# Patient Record
Sex: Female | Born: 1975 | Race: White | Hispanic: No | Marital: Married
Health system: Midwestern US, Community
[De-identification: ages and names within clinical notes are randomized; demographics above are authoritative.]

## PROBLEM LIST (undated history)

## (undated) DIAGNOSIS — F419 Anxiety disorder, unspecified: Secondary | ICD-10-CM

## (undated) DIAGNOSIS — Z8759 Personal history of other complications of pregnancy, childbirth and the puerperium: Secondary | ICD-10-CM

## (undated) DIAGNOSIS — E785 Hyperlipidemia, unspecified: Secondary | ICD-10-CM

## (undated) DIAGNOSIS — IMO0002 Reserved for concepts with insufficient information to code with codable children: Secondary | ICD-10-CM

## (undated) DIAGNOSIS — I10 Essential (primary) hypertension: Secondary | ICD-10-CM

## (undated) DIAGNOSIS — D649 Anemia, unspecified: Secondary | ICD-10-CM

## (undated) DIAGNOSIS — F410 Panic disorder [episodic paroxysmal anxiety] without agoraphobia: Secondary | ICD-10-CM

## (undated) HISTORY — DX: Personal history of other complications of pregnancy, childbirth and the puerperium: Z87.59

## (undated) HISTORY — DX: Reserved for concepts with insufficient information to code with codable children: IMO0002

## (undated) HISTORY — PX: TUBAL LIGATION: SHX77

## (undated) HISTORY — DX: Panic disorder (episodic paroxysmal anxiety): F41.0

## (undated) HISTORY — DX: Anemia, unspecified: D64.9

## (undated) HISTORY — DX: Anxiety disorder, unspecified: F41.9

## (undated) HISTORY — PX: FRACTURE SURGERY: SHX138

---

## 1998-11-01 ENCOUNTER — Inpatient Hospital Stay (HOSPITAL_COMMUNITY): Admission: AD | Admit: 1998-11-01 | Discharge: 1998-11-01 | Payer: Self-pay | Admitting: Obstetrics

## 1998-12-29 ENCOUNTER — Ambulatory Visit (HOSPITAL_COMMUNITY): Admission: RE | Admit: 1998-12-29 | Discharge: 1998-12-29 | Payer: Self-pay | Admitting: *Deleted

## 1999-01-20 ENCOUNTER — Inpatient Hospital Stay (HOSPITAL_COMMUNITY): Admission: AD | Admit: 1999-01-20 | Discharge: 1999-01-20 | Payer: Self-pay | Admitting: *Deleted

## 1999-05-30 ENCOUNTER — Inpatient Hospital Stay (HOSPITAL_COMMUNITY): Admission: AD | Admit: 1999-05-30 | Discharge: 1999-05-30 | Payer: Self-pay | Admitting: *Deleted

## 1999-06-14 ENCOUNTER — Inpatient Hospital Stay (HOSPITAL_COMMUNITY): Admission: AD | Admit: 1999-06-14 | Discharge: 1999-06-15 | Payer: Self-pay | Admitting: *Deleted

## 1999-07-14 ENCOUNTER — Encounter: Admission: RE | Admit: 1999-07-14 | Discharge: 1999-07-14 | Payer: Self-pay | Admitting: Obstetrics

## 1999-07-19 ENCOUNTER — Encounter: Admission: RE | Admit: 1999-07-19 | Discharge: 1999-07-19 | Payer: Self-pay | Admitting: Obstetrics & Gynecology

## 1999-08-08 ENCOUNTER — Ambulatory Visit (HOSPITAL_COMMUNITY): Admission: RE | Admit: 1999-08-08 | Discharge: 1999-08-08 | Payer: Self-pay | Admitting: Obstetrics & Gynecology

## 1999-09-13 ENCOUNTER — Encounter: Admission: RE | Admit: 1999-09-13 | Discharge: 1999-09-13 | Payer: Self-pay | Admitting: Obstetrics & Gynecology

## 2003-04-17 ENCOUNTER — Emergency Department (HOSPITAL_COMMUNITY): Admission: EM | Admit: 2003-04-17 | Discharge: 2003-04-18 | Payer: Self-pay | Admitting: Emergency Medicine

## 2006-03-19 LAB — HM PAP SMEAR: HM Pap smear: ABNORMAL

## 2006-05-18 LAB — HM COLONOSCOPY

## 2006-06-04 ENCOUNTER — Encounter (INDEPENDENT_AMBULATORY_CARE_PROVIDER_SITE_OTHER): Payer: Self-pay | Admitting: *Deleted

## 2006-06-04 ENCOUNTER — Ambulatory Visit (HOSPITAL_COMMUNITY): Admission: RE | Admit: 2006-06-04 | Discharge: 2006-06-04 | Payer: Self-pay | Admitting: Gastroenterology

## 2008-02-01 ENCOUNTER — Emergency Department (HOSPITAL_COMMUNITY): Admission: EM | Admit: 2008-02-01 | Discharge: 2008-02-02 | Payer: Self-pay | Admitting: Emergency Medicine

## 2008-05-01 ENCOUNTER — Ambulatory Visit (HOSPITAL_COMMUNITY): Admission: RE | Admit: 2008-05-01 | Discharge: 2008-05-01 | Payer: Self-pay | Admitting: Obstetrics and Gynecology

## 2008-05-01 ENCOUNTER — Encounter (INDEPENDENT_AMBULATORY_CARE_PROVIDER_SITE_OTHER): Payer: Self-pay | Admitting: Obstetrics and Gynecology

## 2008-05-09 ENCOUNTER — Emergency Department (HOSPITAL_COMMUNITY): Admission: EM | Admit: 2008-05-09 | Discharge: 2008-05-09 | Payer: Self-pay | Admitting: Family Medicine

## 2009-02-11 IMAGING — US US PELVIS COMPLETE MODIFY
1 series · 14 of 25 positions shown · non-contrast
Comparison: none

CLINICAL DATA: Abdominal and rectal pain. 
 TRANSABDOMINAL AND TRANSVAGINAL PELVIC ULTRASOUND:
TECHNIQUE: Both transabdominal and transvaginal ultrasound examinations of the pelvis were performed including evaluation of the uterus, ovaries, adnexal regions, and pelvic cul-de-sac.

[Series 1: unknown · 0.27mm/px · 14 of 47 slices shown]
[im 1/47]
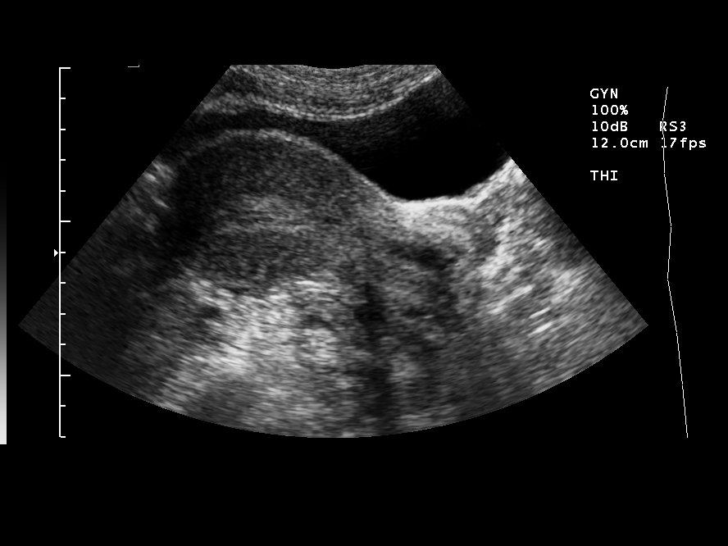
[im 4/47]
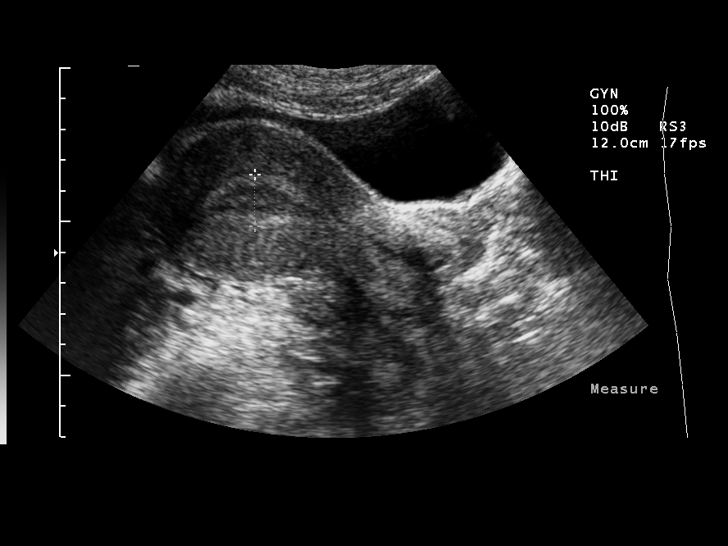
[im 8/47]
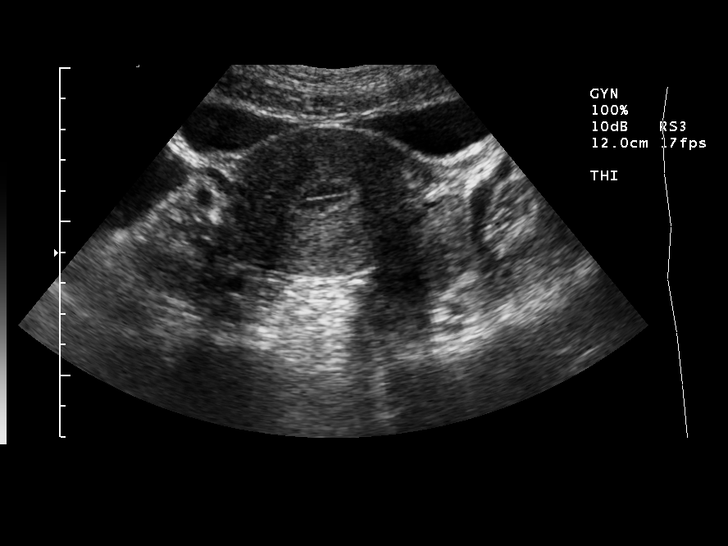
[im 12/47]
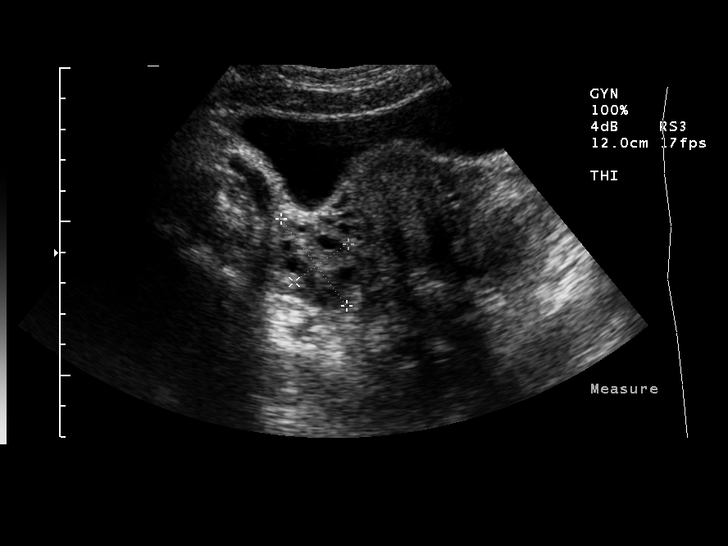
[im 16/47]
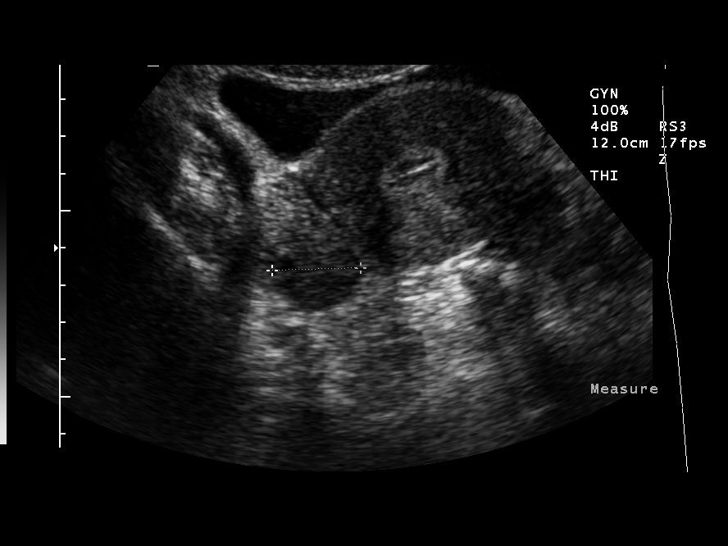
[im 18/47]
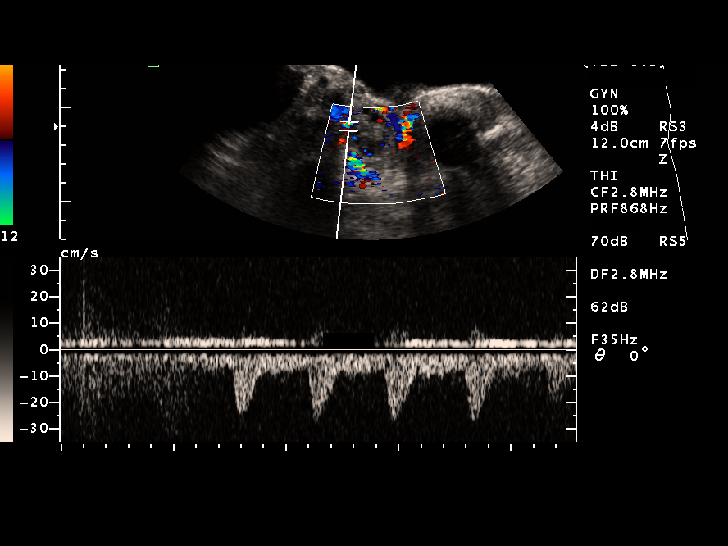
[im 22/47]
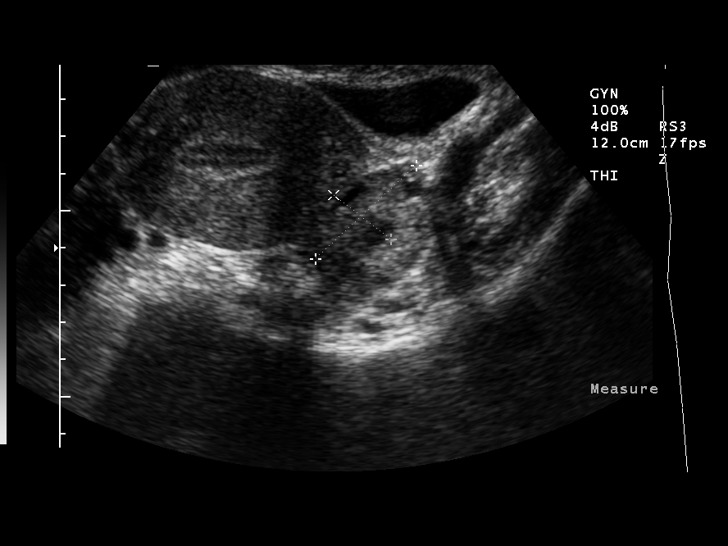
[im 25/47]
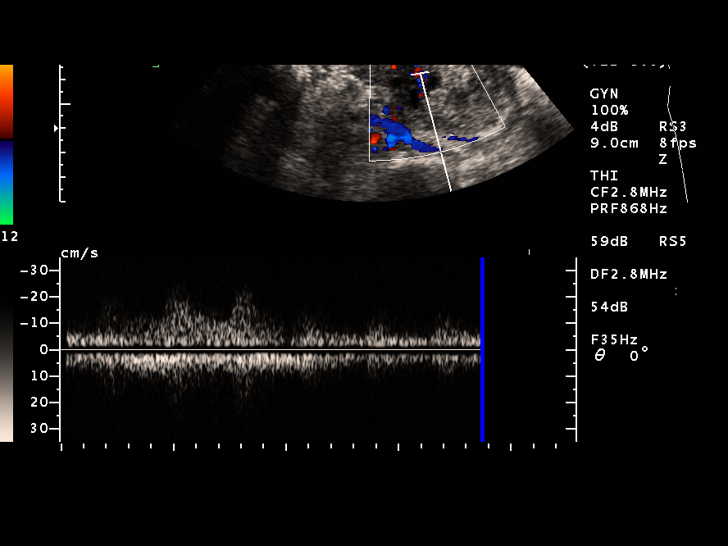
[im 29/47]
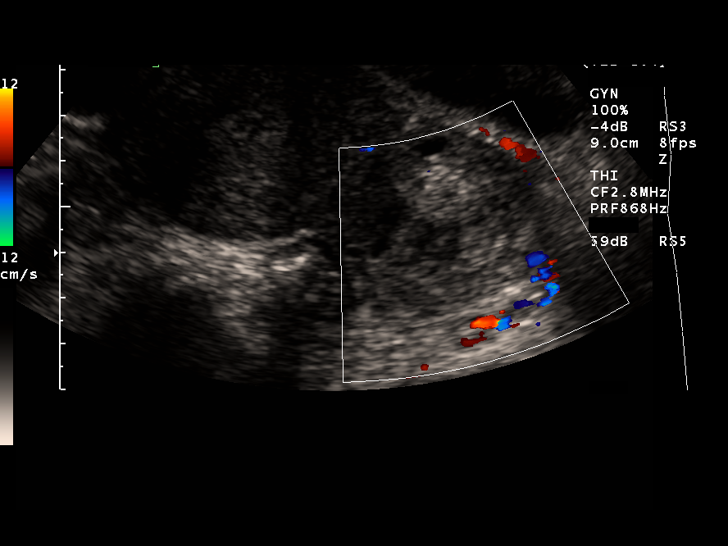
[im 31/47]
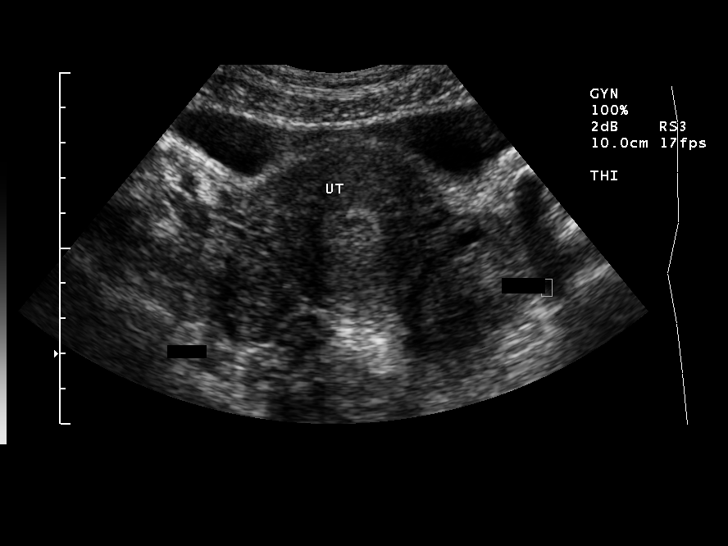
[im 35/47]
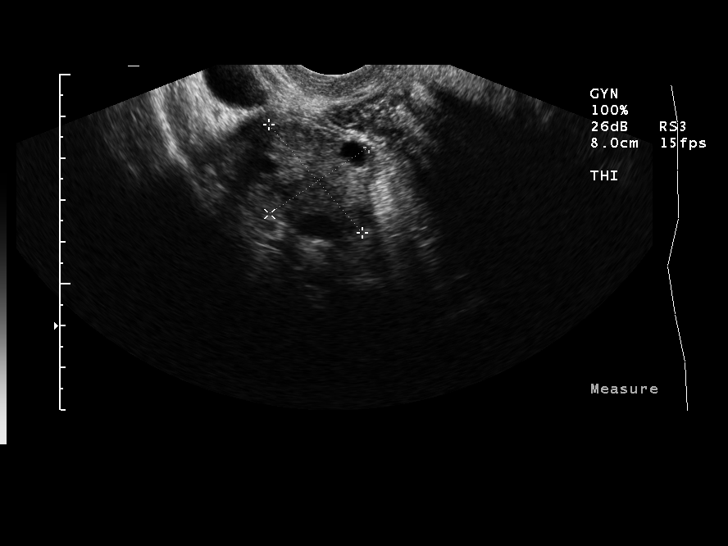
[im 39/47]
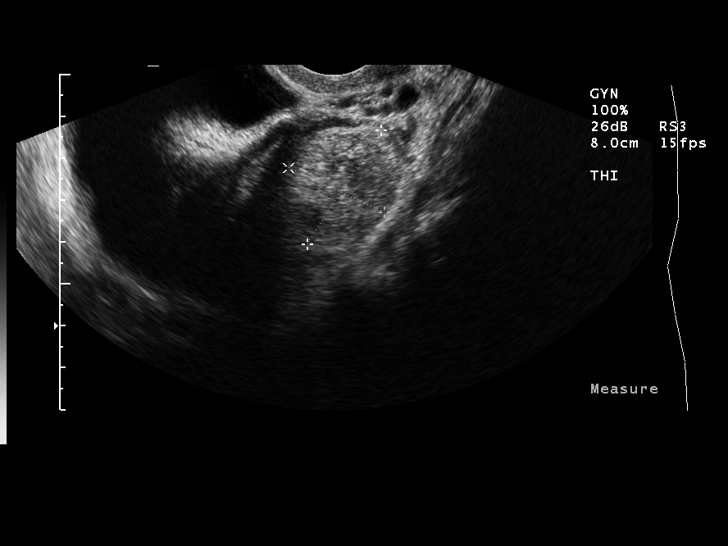
[im 43/47]
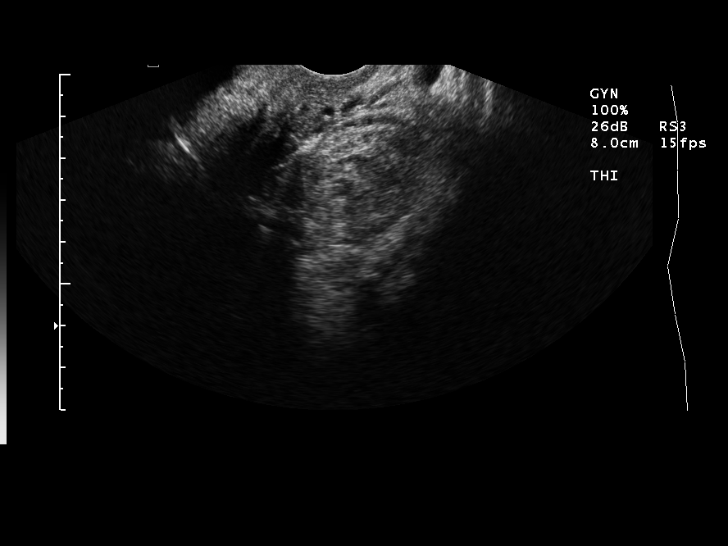
[im 47/47]
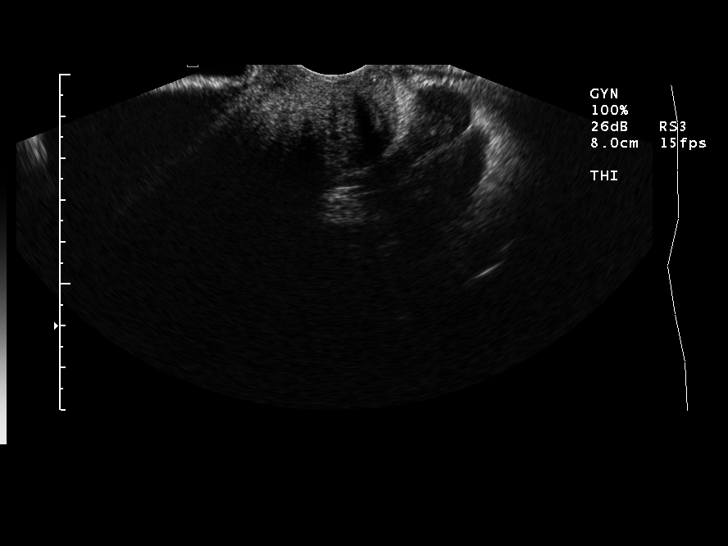

[14 of 25 positions shown; findings below may reference images not displayed]

FINDINGS: The uterus measures 10.6 cm sagittally with a depth of 5.4 cm and width of 6.4 cm.   The endometrium measures 1.4 cm. The ovaries are normal in size. There is blood flow to both ovaries. There is an apparent collapsing cyst on the left of 3.2 x 2.5 x 2.8 cm.  Only a small amount of free fluid is noted.
IMPRESSION: 1.  The ovaries are normal in size and there is blood flow to both ovaries. 
 2.  Collapsing cyst on the left ovary of 3.2 cm.

## 2009-05-19 IMAGING — CR DG WRIST COMPLETE 3+V*L*
4 series · 4 of 4 positions shown · non-contrast
Comparison: None.

CLINICAL DATA: Fall, left wrist and forearm pain

LEFT WRIST - COMPLETE 3+ VIEW

[view not recorded (1 of 4)]
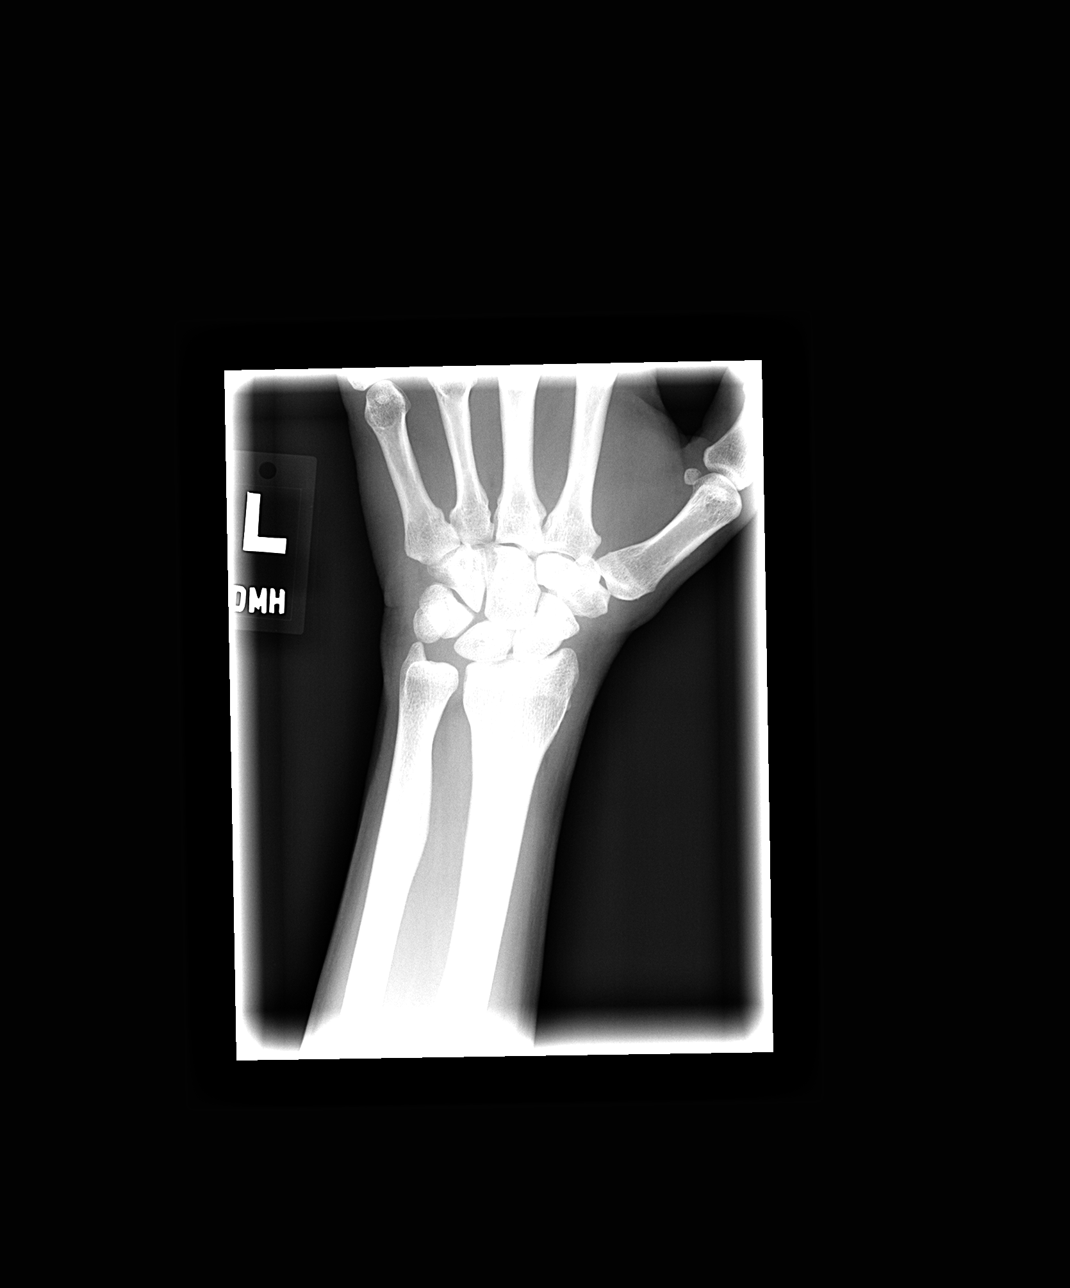

[view not recorded (2 of 4)]
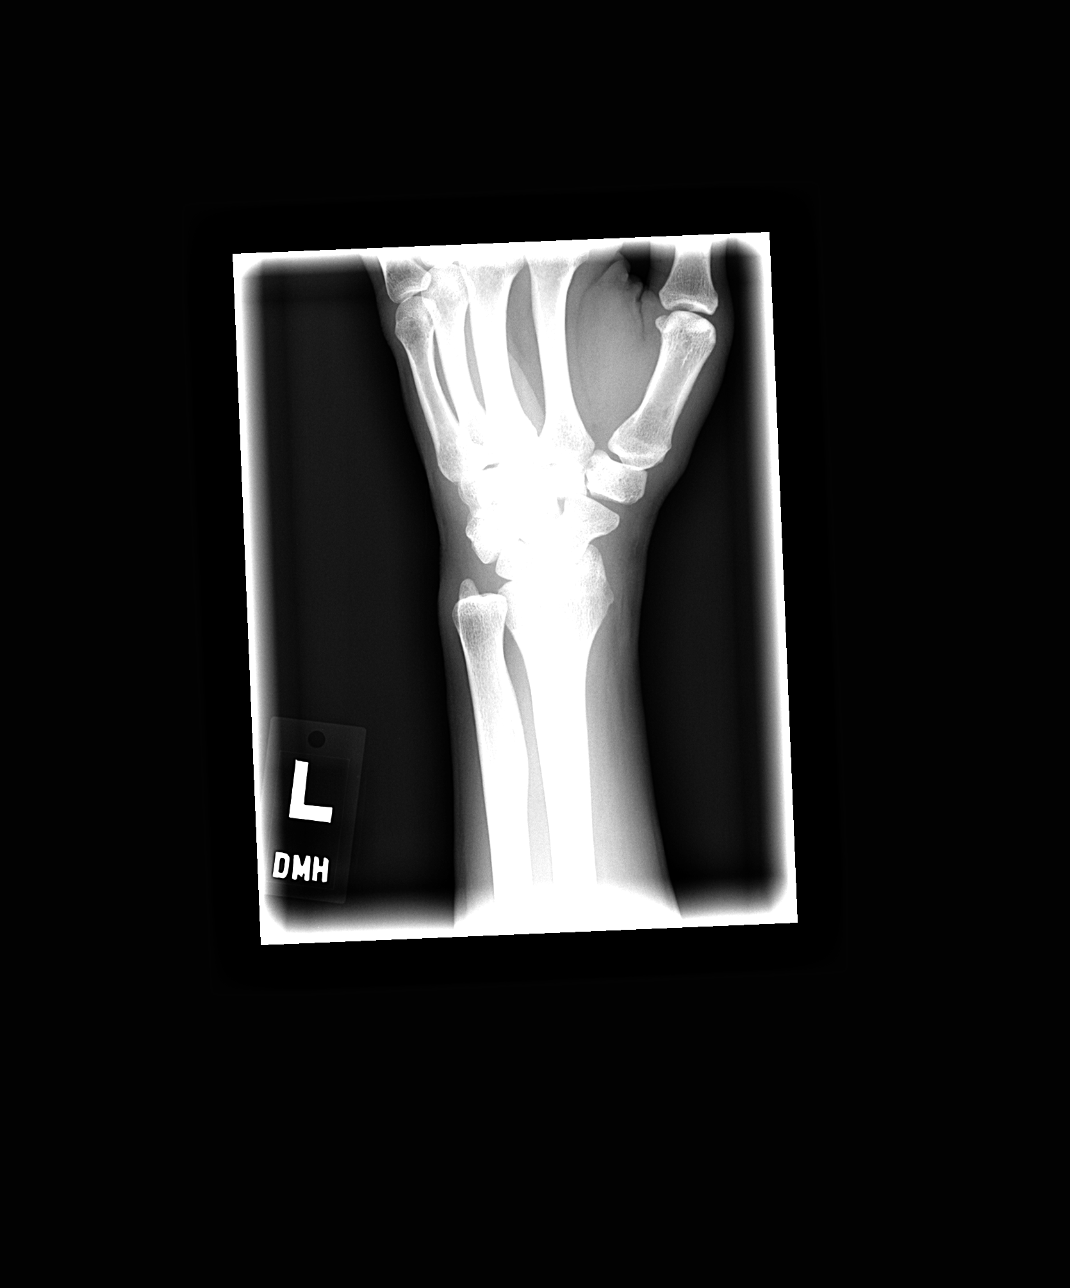

[view not recorded (3 of 4)]
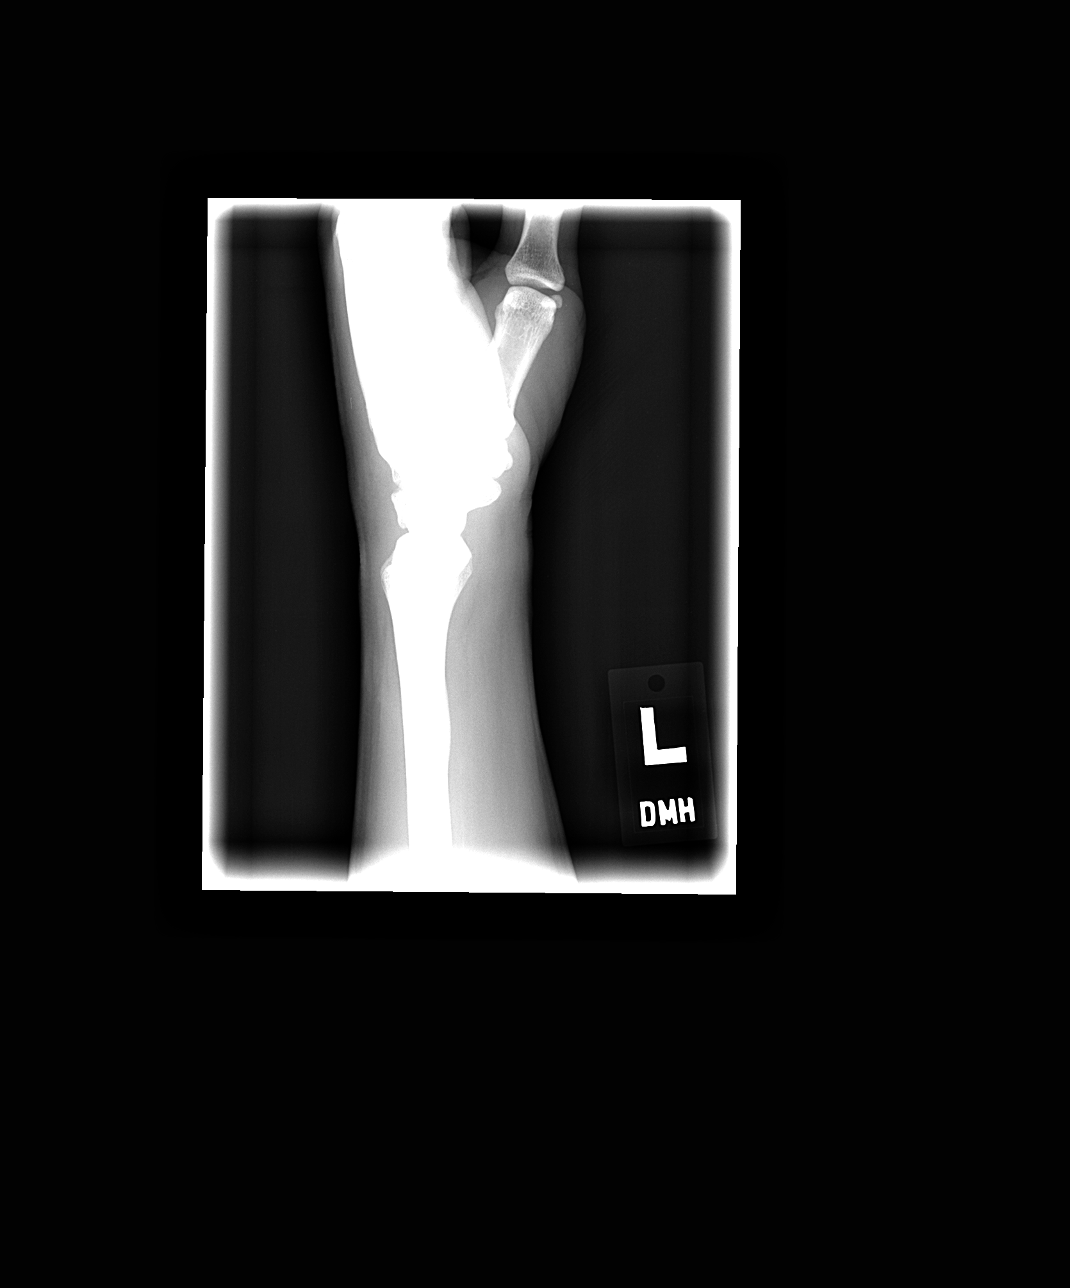

[view not recorded (4 of 4)]
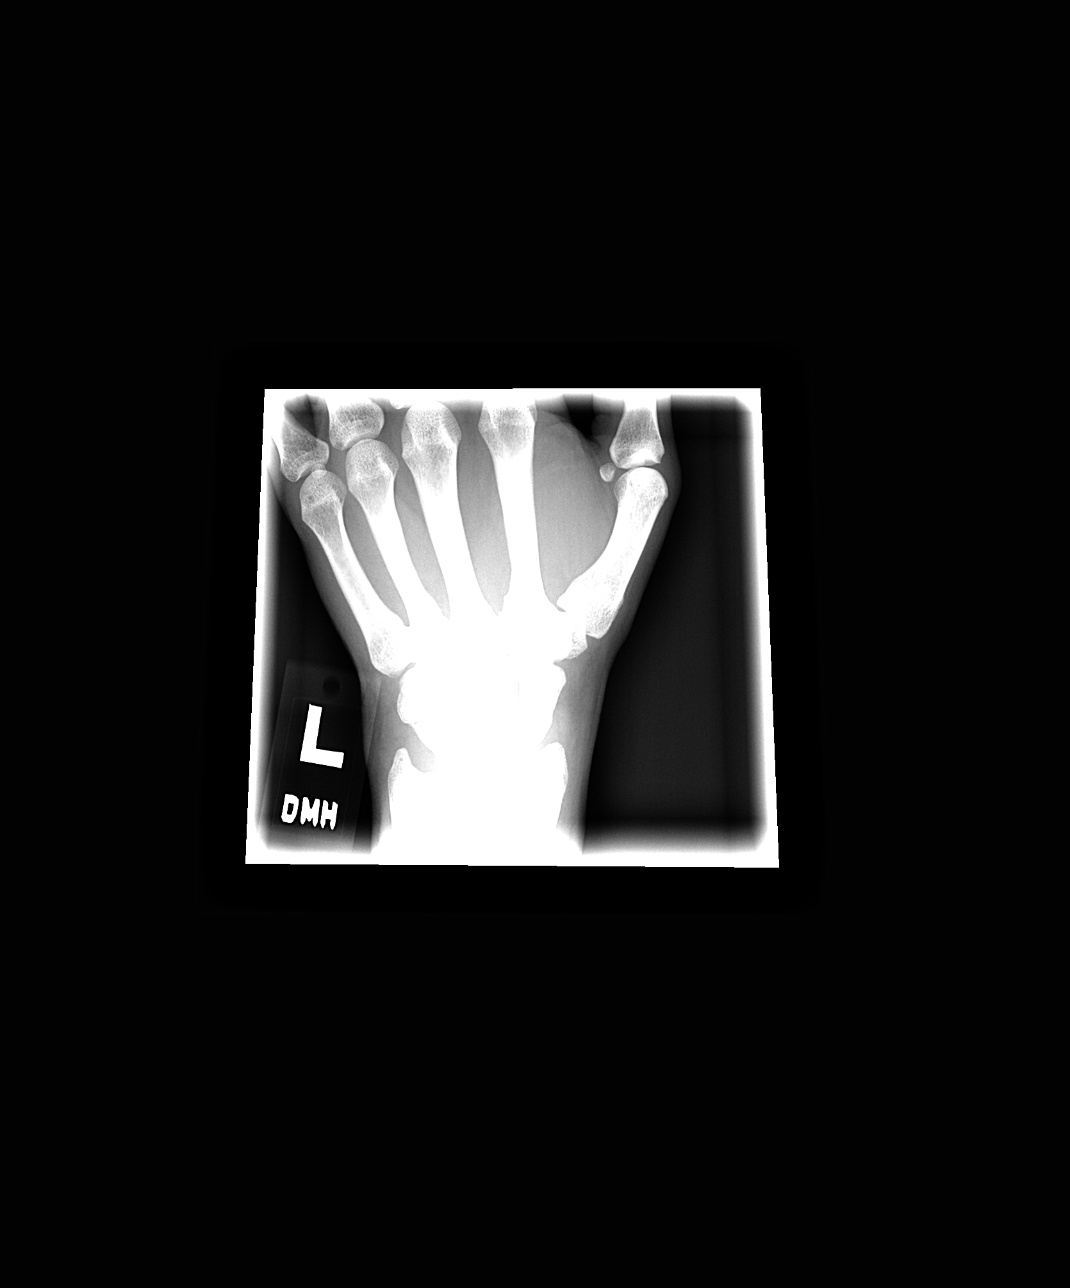

[4 of 4 positions shown; findings below may reference images not displayed]

FINDINGS: Normal alignment without fracture.  No radiopaque foreign
body.  Preserved joint spaces.  Intact distal radius and ulna.
IMPRESSION: No acute finding.

## 2009-05-19 IMAGING — CR DG FOREARM 2V*L*
2 series · 2 of 2 positions shown · non-contrast
Comparison: 05/09/2008

CLINICAL DATA: Fall, left wrist and forearm pain

LEFT FOREARM - 2 VIEW

[view not recorded (1 of 2)]
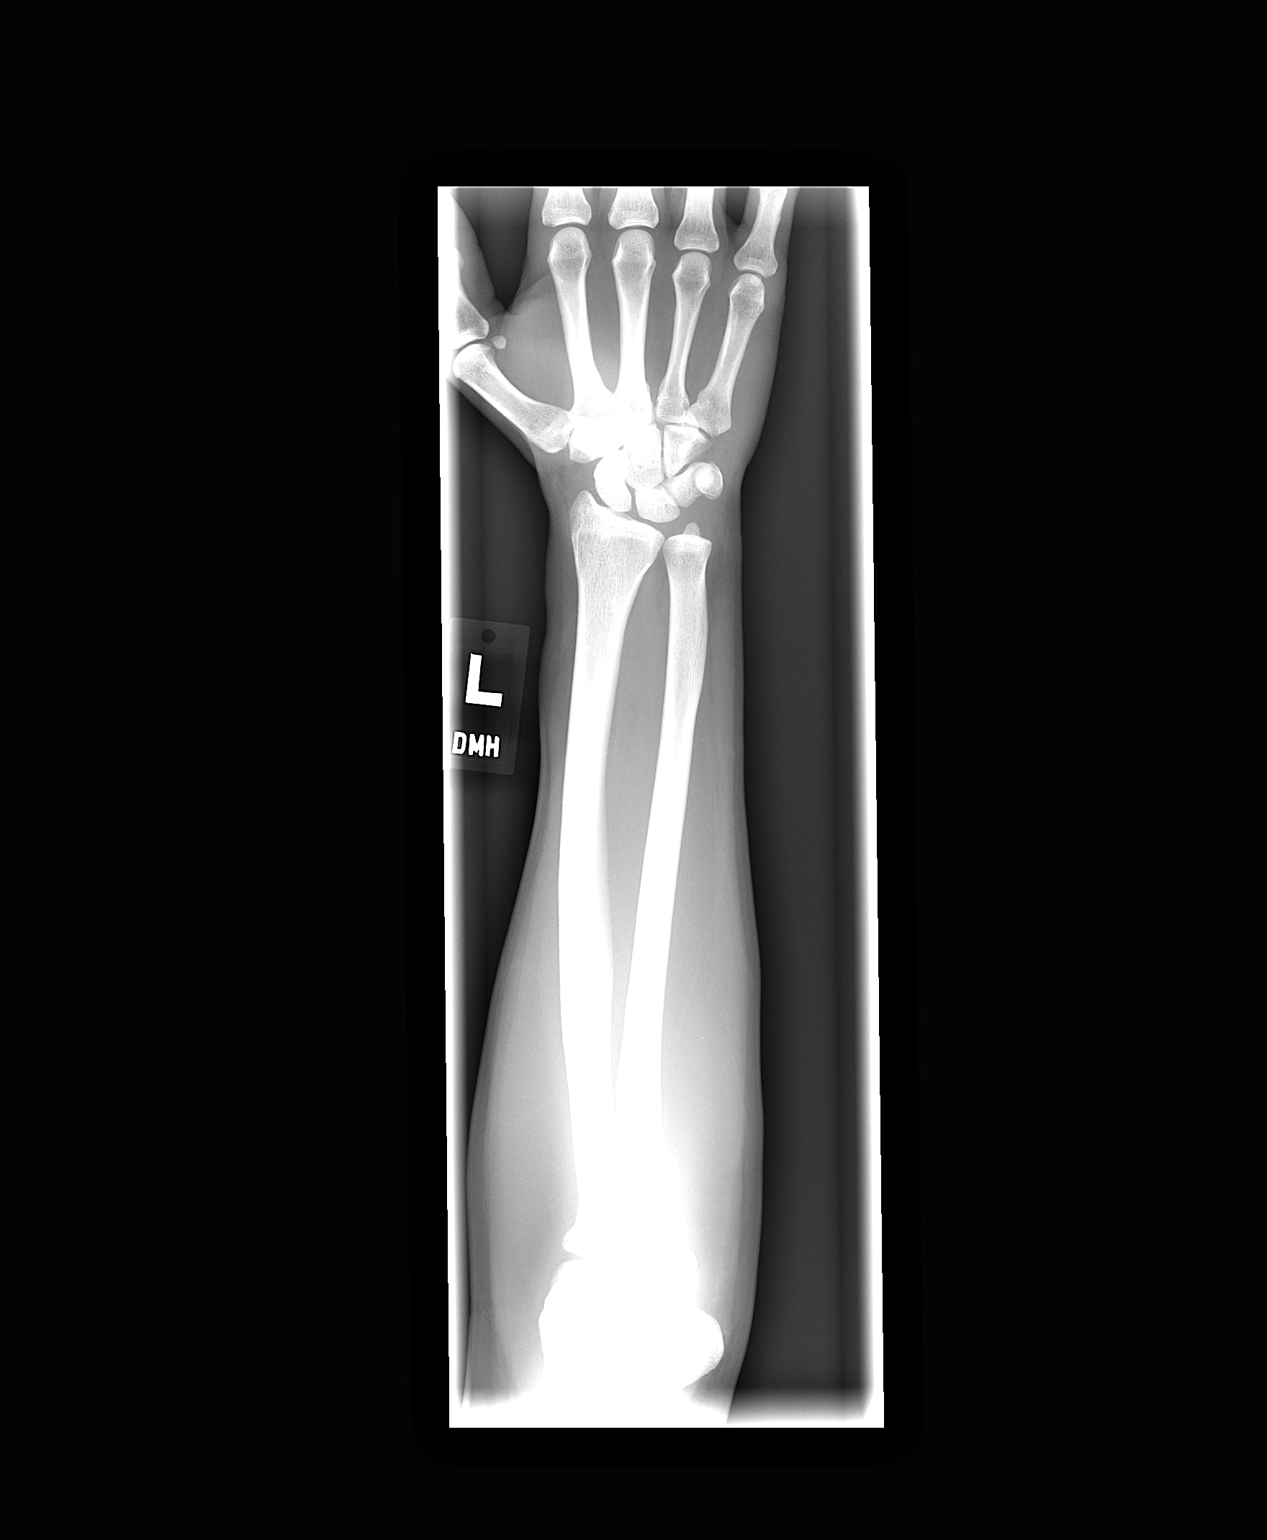

[view not recorded (2 of 2)]
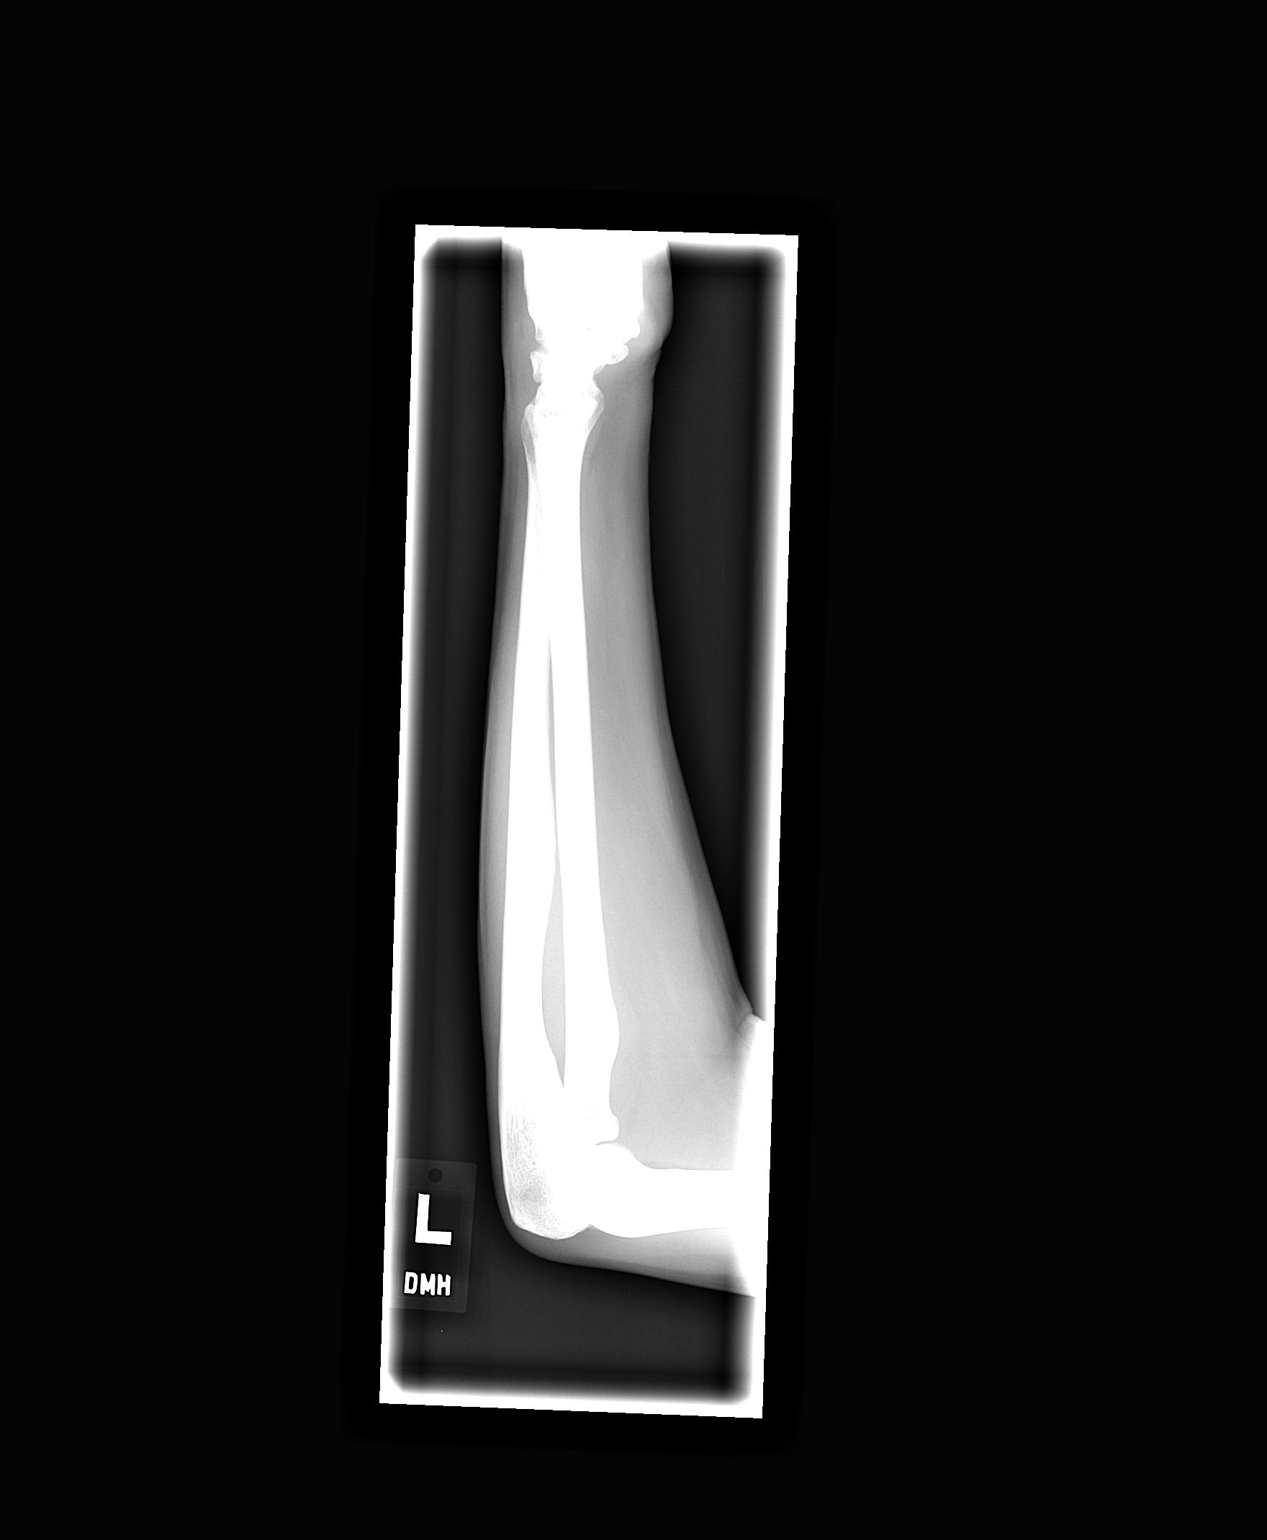

[2 of 2 positions shown; findings below may reference images not displayed]

FINDINGS: Normal alignment.  No fracture.  Intact radius and ulna.
No foreign body or radiographic soft tissue swelling.
IMPRESSION: No acute finding.

## 2009-06-22 ENCOUNTER — Inpatient Hospital Stay (HOSPITAL_COMMUNITY): Admission: AD | Admit: 2009-06-22 | Discharge: 2009-06-22 | Payer: Self-pay | Admitting: Obstetrics and Gynecology

## 2010-07-02 IMAGING — US US PELVIS COMPLETE MODIFY
1 series · 14 of 25 positions shown · non-contrast
Comparison: February 02, 2008

CLINICAL DATA: Abdomen and pelvic pain

TRANSABDOMINAL AND TRANSVAGINAL ULTRASOUND OF PELVIS
TECHNIQUE: Both transabdominal and transvaginal ultrasound
examinations of the pelvis were performed including evaluation of
the uterus, ovaries, adnexal regions, and pelvic cul-de-sac.

[Series 1: us pelvis complete modify · 0.21mm/px · 14 of 50 slices shown]
[im 1/50]
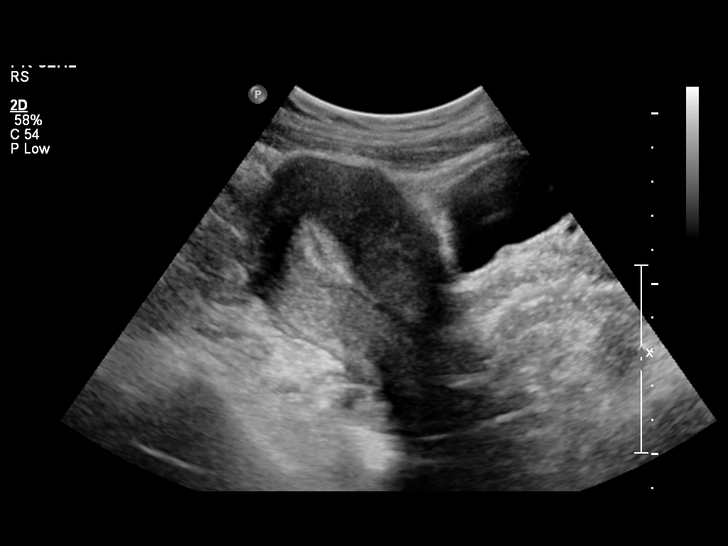
[im 5/50]
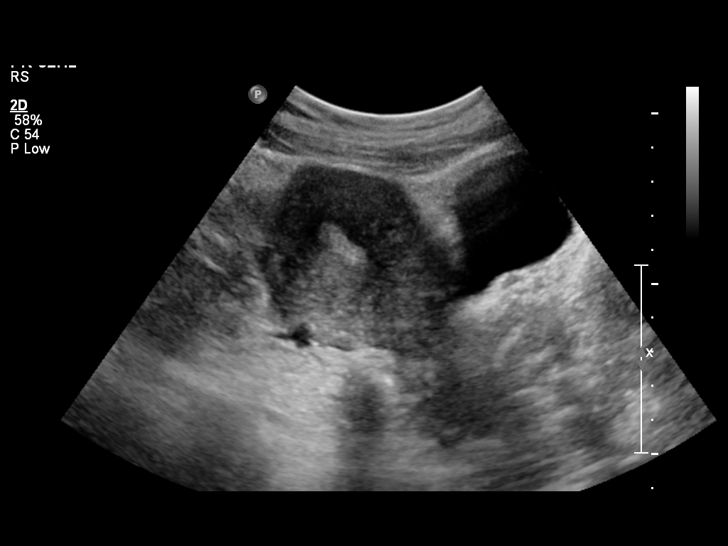
[im 9/50]
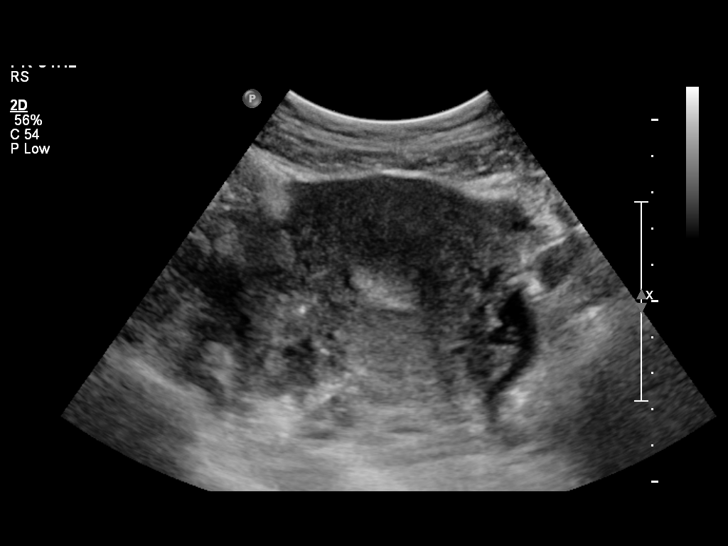
[im 13/50]
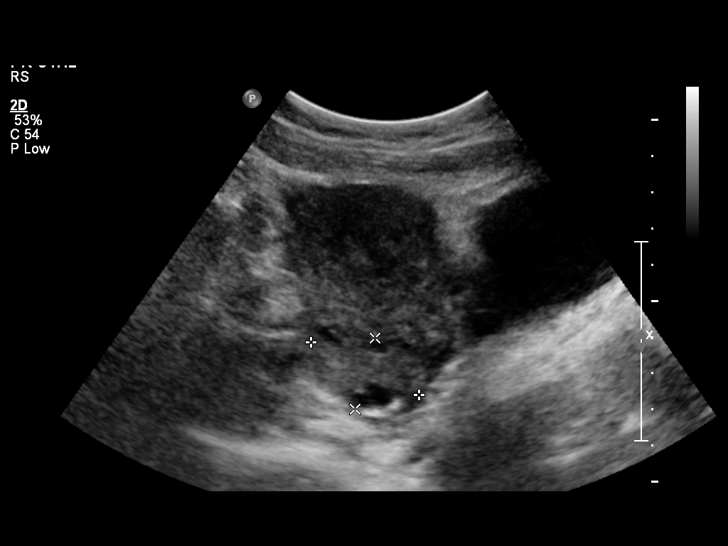
[im 17/50]
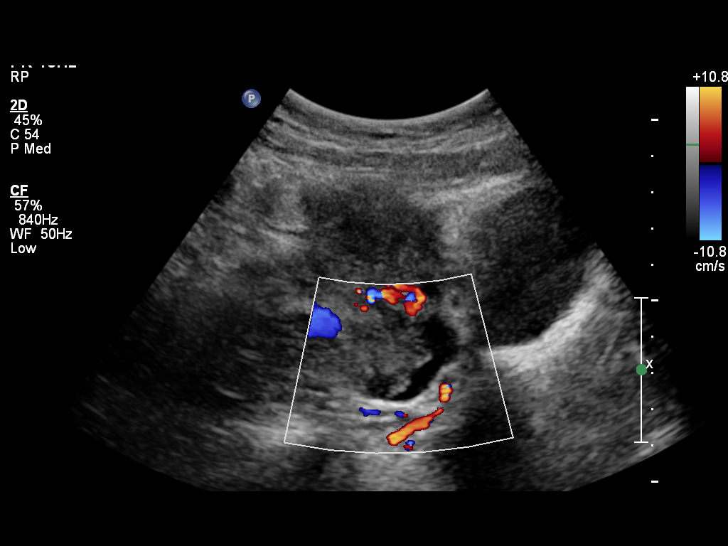
[im 19/50]
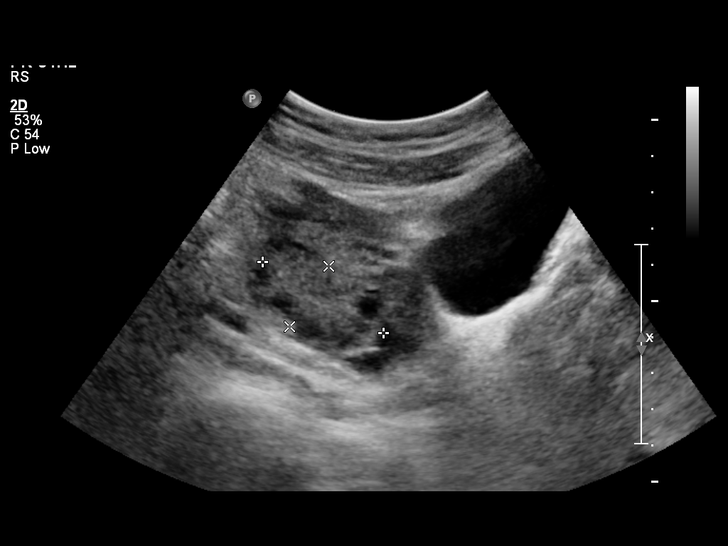
[im 23/50]
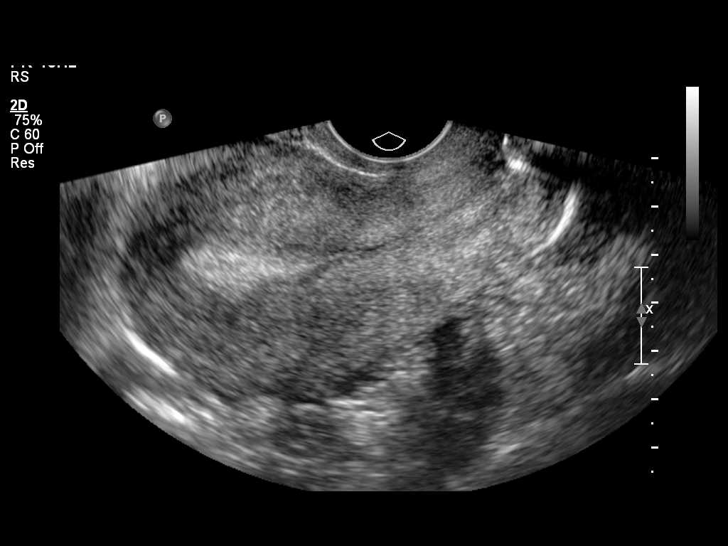
[im 27/50]
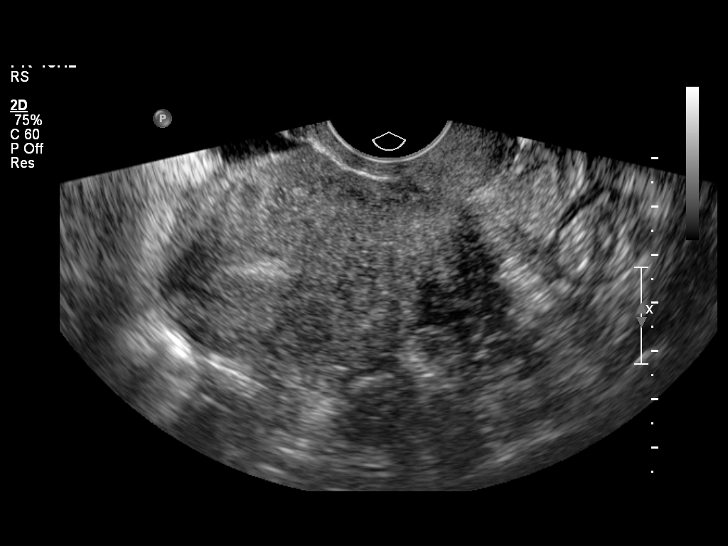
[im 31/50]
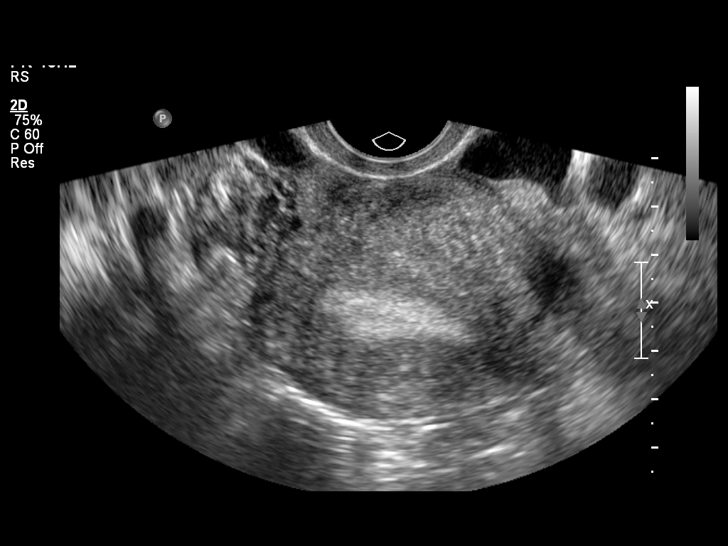
[im 33/50]
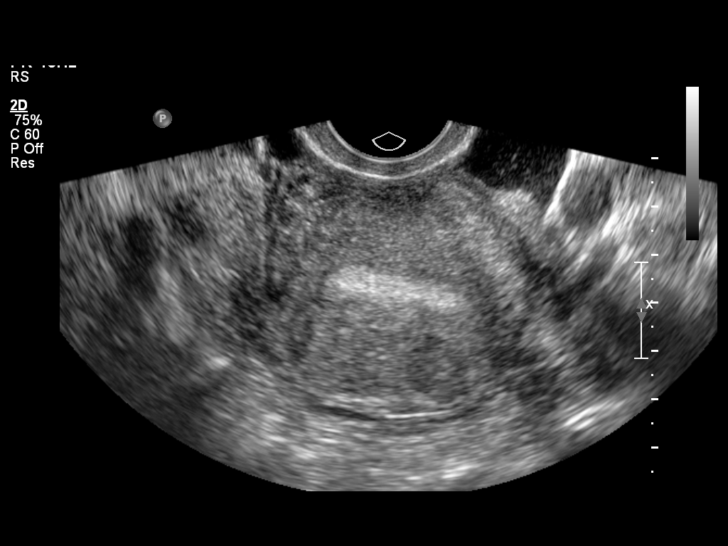
[im 37/50]
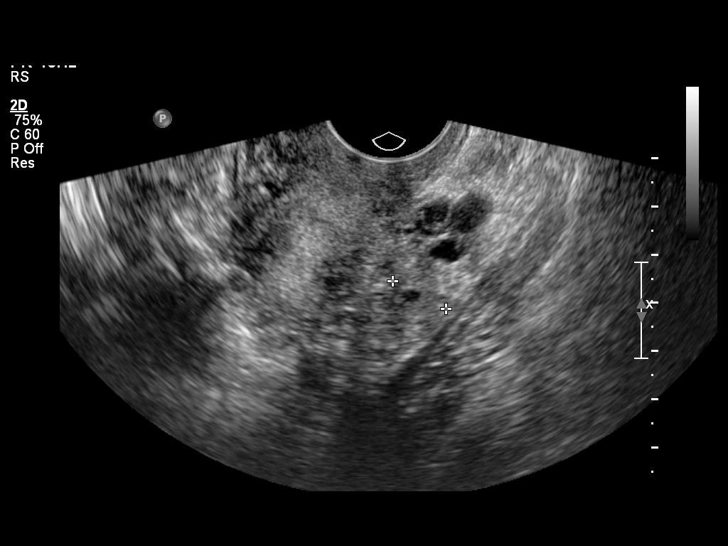
[im 41/50]
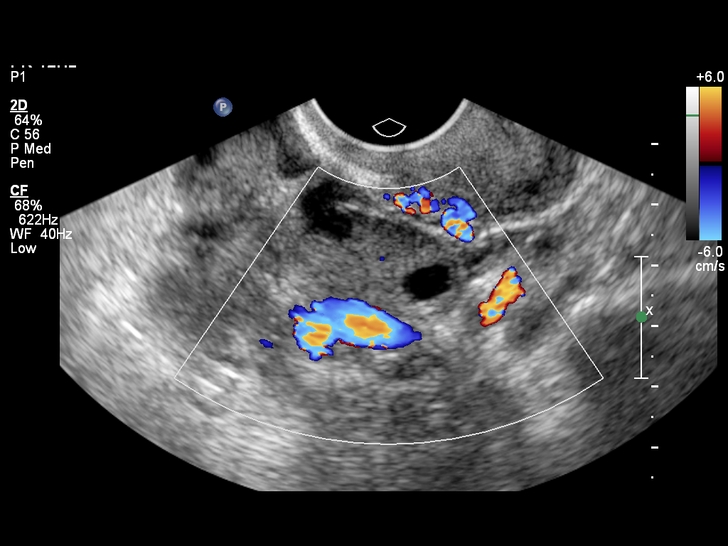
[im 45/50]
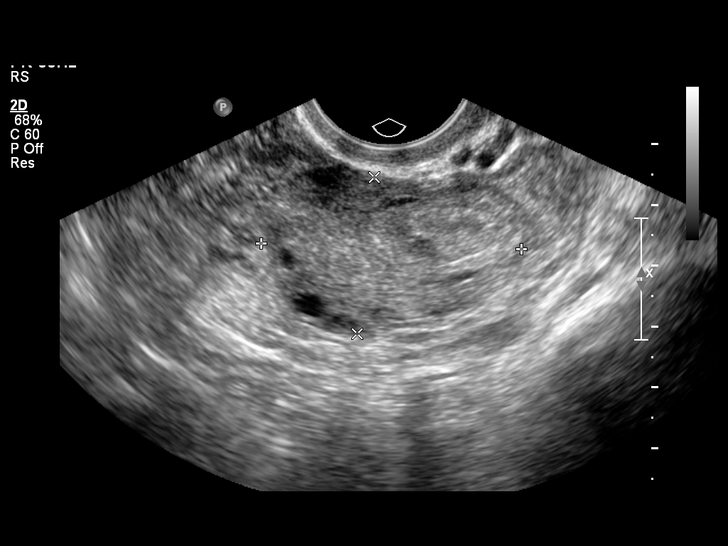
[im 50/50]
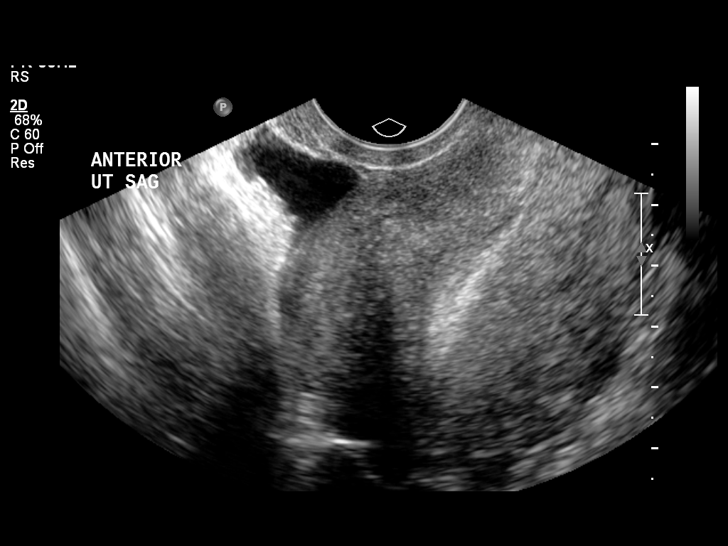

[14 of 25 positions shown; findings below may reference images not displayed]

FINDINGS: The uterus is normal in size and echotexture, measuring
9.5 x 5.0 x 5.9 cm.  The endometrial stripe is thin and
homogeneous, measuring 11 mm in width.  Both ovaries have a normal
size and appearance.  The left ovary measures 4.3 x 2.6 x 3.4 cm,
and the right ovary measures 3.0 x 1.5 x 1.3 cm.  There is a small
amount of free pelvic fluid.
IMPRESSION: Normal pelvic ultrasound.

## 2010-12-06 ENCOUNTER — Emergency Department (HOSPITAL_COMMUNITY)
Admission: EM | Admit: 2010-12-06 | Discharge: 2010-12-06 | Payer: Self-pay | Source: Home / Self Care | Admitting: Family Medicine

## 2011-03-26 LAB — URINALYSIS, ROUTINE W REFLEX MICROSCOPIC
Bilirubin Urine: NEGATIVE
Ketones, ur: NEGATIVE mg/dL
Nitrite: NEGATIVE
Protein, ur: NEGATIVE mg/dL
Specific Gravity, Urine: 1.02 (ref 1.005–1.030)
Urobilinogen, UA: 0.2 mg/dL (ref 0.0–1.0)

## 2011-03-26 LAB — WET PREP, GENITAL

## 2011-03-26 LAB — GC/CHLAMYDIA PROBE AMP, GENITAL: GC Probe Amp, Genital: NEGATIVE

## 2011-05-02 NOTE — Op Note (Signed)
Elaine Johns, Elaine Johns                  ACCOUNT NO.:  [id number]   MEDICAL RECORD NO.:  [id number]          PATIENT TYPE:  AMB   LOCATION:  SDC                           FACILITY:  [facility]   PHYSICIAN:  Naima A. Dillard, M.D. DATE OF BIRTH:  08/06/1976   DATE OF PROCEDURE:  05/01/2008  DATE OF DISCHARGE:                               OPERATIVE REPORT   PREOPERATIVE DIAGNOSIS:  Moderate dysplasia.   POSTOPERATIVE DIAGNOSIS:  Moderate dysplasia.   PROCEDURE:  Colposcopy, LEEP, and endocervical curettage.   SURGEON:  Naima A. Dillard, MD   ASSISTANT:  None.   ANESTHESIA:  General laryngeal mask airway.   FINDINGS:  Acetowhite changes at 6 o'clock and lining area with Lugol's  at 6 o'clock   SPECIMENS:  LEEP specimen of the cervix.  Endocervical curettage sent to  pathology.   ESTIMATED BLOOD LOSS:  Minimal.   COMPLICATIONS:  None.   The patient went to PACU in stable condition.   PROCEDURE IN DETAIL:  The patient was taken to the operating room and  placed in dorsal lithotomy position, prepped and draped with [facility]  Care.  A bivalve speculum was placed into the vagina.  Acetic acid was  placed along the cervix using colposcopy.  The findings noted above were  seen.  The speculum was removed.  The patient was then prepped  thoroughly with Techni-Care after the colposcopy was done and draped in  a normal sterile fashion.  A coated bivalve speculum was placed into the  vagina.  The anterior of the cervix was grasped with a single-tooth  tenaculum.  The large loop was used to remove the LEEP specimen from the  cervix and once loop at 40 cautery ECC was done with the endocervical  curette before the LEEP was done of the cervical bed was made hemostatic  with Bovie cautery and mons Monsel's.  Tenaculum was removed with good  hemostasis.  Sponge, lap, and needle counts were correct.  The patient  went to recovery room in stable condition.      Naima A. Normand Sloop, M.D.  Electronically Signed    NAD/MEDQ  D:  05/01/2008  T:  05/01/2008  Job:  [id number]

## 2011-05-02 NOTE — H&P (Signed)
Elaine Johns, Elaine Johns                  ACCOUNT NO.:  [id number]   MEDICAL RECORD NO.:  [id number]          PATIENT TYPE:  AMB   LOCATION:  SDC                           [facility]ACILITY:  [facility]   PHYSICIAN:  Naima A. Dillard, M.D. DATE OF BIRTH:  1975-12-26   DATE OF ADMISSION:  DATE OF DISCHARGE:                              HISTORY & PHYSICAL   CHIEF COMPLAINT:  CIN-2 moderate dysplasia.   HISTORY OF PRESENT ILLNESS:  The patient is a 35 year old female,  gravida 4, para 3, who presented to me for colposcopy on March 31, 2008  for a high grade squamous intra-epithelial lesion found on her pap  smear. The patient had had a colposcopy in June 2007, in which she had a  biopsy and it was negative. The patient does smoke and is currently not  taking any vitamins. Biopsies were done at 11 and 6 o'clock with an ECC.  The 11 o'clock biopsy was significant for chronic cervicitis. 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 ECC was benign in the cervical  mucosa.   PAST MEDICAL HISTORY:  As above.   PAST SURGICAL HISTORY:  Significant for tubal ligation.   SOCIAL HISTORY:  One pack per day of tobacco. No alcohol or illicit drug  use.   FAMILY HISTORY:  Unremarkable.   REVIEW OF SYSTEMS:  NEUROLOGIC:  No weakness. ENDOCRINE:  No thyroid  disease. CARDIOVASCULAR:  No heart palpitations. RESPIRATORY:  No  asthma. GENITOURINARY:  Significant for dysplasia. HEMATOLOGY:  The  patient denies having any bleeding disorders.   PHYSICAL EXAMINATION:  VITAL SIGNS:  Weight 111 pounds. Pulse 64, blood  pressure 112/58.  HEENT:  Pupils are equal. Hearing is normal. Throat is clear.  NECK:  Thyroid is not enlarged.  HEART:  Regular rate and rhythm.  LUNGS:  Clear to auscultation bilaterally.  ABDOMEN:  Soft, nontender, without any organomegaly.  EXTREMITIES:  No clubbing, cyanosis, or edema.  NEUROLOGIC:  Within normal limits.  GENITOURINARY:  Vaginal within normal limits. Cervix nontender without  any lesions. Uterus is normal shape, size, and consistency and  nontender. Adnexa is no masses and nontender.   ASSESSMENT/PLAN:  After reviewing with the patient that she has CIN-2  moderate dysplasia, high grade dysplasia, the patient was recommended to  have treatment with cryo versus a LEEP. She was also told that she had a  option to do observation. The patient chose LEEP. She understands the  risks are not limited to bleeding, infection, damage to internal organs  such as bowel, bladder, and major blood vessels. She also understands  that she may need  several other procedures to treat this disease. She also understands  that smoking tobacco can make it worse and I encouraged smoking  cessation. The patient understood the risks and desires to proceed with  LEEP. Risks and benefits were reviewed.      Naima A. Normand Sloop, M.D.  Electronically Signed     NAD/MEDQ  D:  04/30/2008  T:  04/30/2008  Job:  [id number]

## 2011-05-05 NOTE — Op Note (Signed)
NAMEFE, OKUBO                 ACCOUNT NO.:  [id number]   MEDICAL RECORD NO.:  [id number]          PATIENT TYPE:  AMB   LOCATION:  ENDO                         FACILITY:  [facility]   PHYSICIAN:  Anselmo Rod, M.D.  DATE OF BIRTH:  [birth year]   DATE OF PROCEDURE:  06/04/2006  DATE OF DISCHARGE:                                 OPERATIVE REPORT   PROCEDURE PERFORMED:  Colonoscopy with snare polypectomy x 1.   ENDOSCOPIST:  Anselmo Rod, M.D.   INSTRUMENT USED:  Olympus video colonoscope.   INDICATIONS FOR PROCEDURE:  The patient is a 35 year old white female with a  family history of colon cancer in her father, undergoing screening  colonoscopy to rule out colonic polyps, masses, etc.   PREPROCEDURE PREPARATION:  Informed consent was procured from the patient.  The patient was fasted for four hours prior to the procedure and prepped  with OsmoPrep pills the night of and the morning of the procedure.  The  risks and benefits of the procedure including a 10% miss rate for cancer or  polyps was discussed with the patient as well.   PREPROCEDURE PHYSICAL:  The patient had stable vital signs.  Neck supple.  Chest clear to auscultation.  S1 and S2 regular.  Abdomen soft with normal  bowel sounds.   DESCRIPTION OF PROCEDURE:  The patient was placed in left lateral decubitus  position and sedated with 85 mcg of fentanyl and 7.5 mg of Versed in slow  incremental doses.  Once the patient was adequately sedated and maintained  on low flow oxygen and continuous cardiac monitoring, the Olympus video  colonoscope was advanced from the rectum to the cecum.  The appendicular  orifice and ileocecal valve were clearly visualized and photographed.  The  terminal ileum appeared normal.  A small polyp was snared from 85 cm.  The  rest of the exam was unremarkable.   IMPRESSION:  1.Small polyp removed by hot snare from 85 cm.  2.Otherwise normal colonoscopy up to the terminal ileum.   RECOMMENDATIONS:  1.Await pathology results.  2.Repeat colonoscopy depending on pathology results.  3.Avoid all nonsteroidals including aspirin for the next two weeks.  4.Outpatient followup as need arises in the future.      Anselmo Rod, M.D.  Electronically Signed     JNM/MEDQ  D:  06/04/2006  T:  06/05/2006  Job:  [id number]   cc:   Ernestina Penna, M.D.  Fax: [phone]

## 2011-06-29 ENCOUNTER — Encounter: Payer: Self-pay | Admitting: Family Medicine

## 2011-06-29 DIAGNOSIS — F410 Panic disorder [episodic paroxysmal anxiety] without agoraphobia: Secondary | ICD-10-CM | POA: Insufficient documentation

## 2011-09-08 LAB — I-STAT 8, (EC8 V) (CONVERTED LAB)
BUN: 4 — ABNORMAL LOW
Glucose, Bld: 81
Hemoglobin: 15
Potassium: 3.5
Sodium: 139
TCO2: 22
pH, Ven: 7.469 — ABNORMAL HIGH

## 2011-09-08 LAB — WET PREP, GENITAL
Trich, Wet Prep: NONE SEEN
Yeast Wet Prep HPF POC: NONE SEEN

## 2011-09-08 LAB — URINALYSIS, ROUTINE W REFLEX MICROSCOPIC
Bilirubin Urine: NEGATIVE
Ketones, ur: NEGATIVE
Leukocytes, UA: NEGATIVE
Nitrite: NEGATIVE
Protein, ur: NEGATIVE
Urobilinogen, UA: 0.2

## 2011-09-08 LAB — CBC
Hemoglobin: 14.1
MCHC: 34.7
MCV: 93.6
RBC: 4.33
WBC: 16.7 — ABNORMAL HIGH

## 2011-09-08 LAB — DIFFERENTIAL
Basophils Relative: 0
Eosinophils Absolute: 0.1
Lymphs Abs: 2.7
Monocytes Absolute: 0.5
Monocytes Relative: 3

## 2011-09-08 LAB — POCT I-STAT CREATININE: Creatinine, Ser: 0.8

## 2011-09-08 LAB — GC/CHLAMYDIA PROBE AMP, GENITAL: GC Probe Amp, Genital: NEGATIVE

## 2011-09-10 ENCOUNTER — Emergency Department (HOSPITAL_COMMUNITY)
Admission: EM | Admit: 2011-09-10 | Discharge: 2011-09-11 | Disposition: A | Discharge status: Home / Self Care | Payer: BC Managed Care – PPO | Attending: Emergency Medicine | Admitting: Emergency Medicine

## 2011-09-10 DIAGNOSIS — S8990XA Unspecified injury of unspecified lower leg, initial encounter: Secondary | ICD-10-CM | POA: Insufficient documentation

## 2011-09-10 DIAGNOSIS — Z79899 Other long term (current) drug therapy: Secondary | ICD-10-CM | POA: Insufficient documentation

## 2011-09-10 DIAGNOSIS — Y92009 Unspecified place in unspecified non-institutional (private) residence as the place of occurrence of the external cause: Secondary | ICD-10-CM | POA: Insufficient documentation

## 2011-09-10 DIAGNOSIS — M25473 Effusion, unspecified ankle: Secondary | ICD-10-CM | POA: Insufficient documentation

## 2011-09-10 DIAGNOSIS — M25579 Pain in unspecified ankle and joints of unspecified foot: Secondary | ICD-10-CM | POA: Insufficient documentation

## 2011-09-10 DIAGNOSIS — X500XXA Overexertion from strenuous movement or load, initial encounter: Secondary | ICD-10-CM | POA: Insufficient documentation

## 2011-09-10 DIAGNOSIS — M25476 Effusion, unspecified foot: Secondary | ICD-10-CM | POA: Insufficient documentation

## 2011-09-10 DIAGNOSIS — S99919A Unspecified injury of unspecified ankle, initial encounter: Secondary | ICD-10-CM | POA: Insufficient documentation

## 2011-09-10 DIAGNOSIS — S93409A Sprain of unspecified ligament of unspecified ankle, initial encounter: Secondary | ICD-10-CM | POA: Insufficient documentation

## 2011-09-11 ENCOUNTER — Emergency Department (HOSPITAL_COMMUNITY): Payer: BC Managed Care – PPO

## 2011-09-13 LAB — CBC
HCT: 40
Hemoglobin: 13.7
MCHC: 34.3
MCV: 93.5
RBC: 4.28
WBC: 10.4

## 2013-03-05 ENCOUNTER — Telehealth: Payer: Self-pay | Admitting: Physician Assistant

## 2013-03-05 MED ORDER — DIAZEPAM 2 MG PO TABS: 2.0000 mg | ORAL_TABLET | Freq: Four times a day (QID) | ORAL | Status: DC | PRN

## 2013-03-05 NOTE — Telephone Encounter (Signed)
Call in rx . Call pt.

## 2013-03-05 NOTE — Telephone Encounter (Signed)
RX called in.  Called patient left mess that Rx was ready.

## 2013-04-07 ENCOUNTER — Encounter: Payer: Self-pay | Admitting: Family Medicine

## 2013-04-07 ENCOUNTER — Telehealth: Payer: Self-pay | Admitting: Physician Assistant

## 2013-04-07 MED ORDER — DIAZEPAM 2 MG PO TABS: 2.0000 mg | ORAL_TABLET | Freq: Two times a day (BID) | ORAL | Status: DC | PRN

## 2013-04-07 NOTE — Telephone Encounter (Signed)
Tell pt to schedule ROV.  Will fill x 1 to hold her over but no further refills in future until has ov. Call in Rx Valium 2mg  one BID prn # 30/0

## 2013-04-07 NOTE — Telephone Encounter (Signed)
Diazepam 10 mg take 1 tablet by mouth daily as needed #30 last rf 03/05/2013

## 2013-04-07 NOTE — Telephone Encounter (Signed)
This encounter was created in error - please disregard.

## 2013-04-07 NOTE — Telephone Encounter (Signed)
Diazepam 2mg  q6hr prn  #30   Last refill 03/05/13   Last ov 12/03/12 for injury visit.  Last real ov for chronic conditions and labs 05/10/12 Need approval for controlled medication.

## 2013-04-07 NOTE — Telephone Encounter (Signed)
Pt called.  Not sure how dose of diazepam was increased to 10 mg.  Pt not sure either, just knows since November it has been 10 mg.  Also states not taking regularly and would often cut tablet in half.  Recommended to make appt to come see provider soon.  Pt states due for annual CPE.  Will refill 2 mg diazepam for one month to hold over until appt.

## 2013-04-07 NOTE — Addendum Note (Signed)
Addended by: Donne Anon on: 04/07/2013 04:33 PM   Modules accepted: Orders

## 2013-05-02 ENCOUNTER — Other Ambulatory Visit: Payer: Self-pay | Admitting: Physician Assistant

## 2013-05-15 ENCOUNTER — Telehealth: Payer: Self-pay | Admitting: Family Medicine

## 2013-05-16 ENCOUNTER — Telehealth: Payer: Self-pay | Admitting: Family Medicine

## 2013-05-16 MED ORDER — DIAZEPAM 2 MG PO TABS: 2.0000 mg | ORAL_TABLET | Freq: Two times a day (BID) | ORAL | Status: DC | PRN

## 2013-05-16 MED ORDER — DIAZEPAM 2 MG PO TABS: 2.0000 mg | ORAL_TABLET | Freq: Every day | ORAL | Status: DC

## 2013-05-16 NOTE — Telephone Encounter (Signed)
One refill then patient need to be seen for annual CPE.

## 2013-05-16 NOTE — Telephone Encounter (Signed)
?   OK to Refill  

## 2013-05-16 NOTE — Telephone Encounter (Signed)
Ok to refill 

## 2013-05-16 NOTE — Addendum Note (Signed)
Addended by: Donne Anon on: 05/16/2013 04:40 PM   Modules accepted: Orders

## 2013-05-16 NOTE — Telephone Encounter (Signed)
Rx Refilled  

## 2013-05-21 ENCOUNTER — Telehealth: Payer: Self-pay | Admitting: Family Medicine

## 2013-05-21 MED ORDER — PAROXETINE HCL 20 MG PO TABS: 20.0000 mg | ORAL_TABLET | ORAL | Status: DC

## 2013-05-21 NOTE — Telephone Encounter (Signed)
Med rf °

## 2013-06-05 ENCOUNTER — Ambulatory Visit: Payer: Self-pay | Admitting: Physician Assistant

## 2013-06-11 ENCOUNTER — Ambulatory Visit: Payer: Self-pay | Admitting: Physician Assistant

## 2013-07-21 ENCOUNTER — Other Ambulatory Visit: Payer: Self-pay | Admitting: Family Medicine

## 2013-07-21 ENCOUNTER — Encounter: Payer: Self-pay | Admitting: Family Medicine

## 2013-07-21 NOTE — Telephone Encounter (Signed)
Medication refill for one time only.  Patient needs to be seen.  Letter sent for patient to call and schedule 

## 2013-08-26 ENCOUNTER — Other Ambulatory Visit: Payer: Self-pay | Admitting: Family Medicine

## 2013-08-26 ENCOUNTER — Encounter: Payer: Self-pay | Admitting: Family Medicine

## 2013-08-26 NOTE — Telephone Encounter (Signed)
Medication refill for one time only.  Patient needs to be seen.  Letter sent for patient to call and schedule 

## 2013-09-24 ENCOUNTER — Encounter: Payer: Self-pay | Admitting: Physician Assistant

## 2013-09-24 ENCOUNTER — Ambulatory Visit (INDEPENDENT_AMBULATORY_CARE_PROVIDER_SITE_OTHER): Payer: BC Managed Care – PPO | Admitting: Physician Assistant

## 2013-09-24 VITALS — BP 146/98 | HR 100 | Temp 99.2°F | Resp 18 | Ht 59.0 in | Wt 99.0 lb

## 2013-09-24 DIAGNOSIS — R7989 Other specified abnormal findings of blood chemistry: Secondary | ICD-10-CM

## 2013-09-24 DIAGNOSIS — F410 Panic disorder [episodic paroxysmal anxiety] without agoraphobia: Secondary | ICD-10-CM

## 2013-09-24 DIAGNOSIS — E559 Vitamin D deficiency, unspecified: Secondary | ICD-10-CM

## 2013-09-24 MED ORDER — DIAZEPAM 2 MG PO TABS: 2.0000 mg | ORAL_TABLET | Freq: Every day | ORAL | Status: DC

## 2013-09-24 MED ORDER — PAROXETINE HCL 20 MG PO TABS: ORAL_TABLET | ORAL | Status: DC

## 2013-09-24 NOTE — Progress Notes (Signed)
Patient ID: Elaine Johns MRN: [id number], DOB: March 24, 1976, 37 y.o. Date of Encounter: @DATE @  Chief Complaint:  Chief Complaint  Patient presents with  . follow up for md refill    HPI: 37 y.o. year old white female  presents for routine followup office visit.  She says she's been doing very well. Is that she is now working part-time at the [employer] on [employer]. Works in the office. Says prior to this she had been staying home with her children who are now ages 47, 62, and 52.  Says the Paxil 20 mg is working very well controlling her panic disorder. She has not even gotten a refill on her Valium in several months. Says she did not want to use this unless absolutely necessary so has not even requested a refill. Says there have been a few times at work where she could have probably taken one but instead she walked to a different area, took some deep breaths, and was able to control her symptoms. No other panic recently.  Is still taking over-the-counter vitamin D 2000 units daily ever since her lab came back low April 2013.  Also prior labs some LFTs were slightly elevated. At that time she reported occasional alcohol use but was frequently using Tylenol. He says since then she has taken no Tylenol. This is her alcohol use is still about the same as it was at that time which is very little.   Past Medical History  Diagnosis Date  . Panic disorder   . History of miscarriage   . 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 Any medications entered into computer today will not appear on this note's list. The medications listed below were entered prior to today. Current Outpatient Prescriptions on File Prior to Visit  Medication Sig Dispense Refill  . folic acid (FOLVITE) 0.5 MG tablet Take 1 mg by mouth daily.         No current facility-administered medications on file prior to visit.     Allergies:  Allergies  Allergen Reactions  . Antihistamines, Chlorpheniramine-Type     Raised heart rate.    History   Social History  . Marital Status: Married    Spouse Name: N/A    Number of Children: N/A  . Years of Education: N/A   Occupational History  . Not on file.   Social History Main Topics  . Smoking status: Current Every Day Smoker -- 1.00 packs/day    Types: Cigarettes  . Smokeless tobacco: Never Used  . Alcohol Use: Yes  . Drug Use: No  . Sexual Activity: Not on file   Other Topics Concern  . Not on file   Social History Narrative  . No narrative on file    No family history on file.   Review of Systems:  See HPI for pertinent ROS. All other ROS negative.    Physical Exam: Blood pressure 134/82, pulse 100, temperature 99.2 F (37.3 C), temperature source Oral, resp. rate 18, height 4\' 11"  (1.499 m), weight 99 lb (44.906 kg), last menstrual period 09/07/2013., Body mass index is 19.98 kg/(m^2). General: Well-nourished well-developed white female. Appears in no acute distress. Lungs: Clear bilaterally to auscultation without wheezes, rales, or rhonchi. Breathing is unlabored. Heart: RRR with S1 S2. No murmurs, rubs, or gallops. Musculoskeletal:  Strength and tone normal for age. Extremities/Skin: Warm and dry. No clubbing or cyanosis.  No edema. No rashes or suspicious lesions. Neuro: Alert and oriented X 3. Moves all extremities spontaneously. Gait is normal. CNII-XII grossly in tact. Psych:  Responds to questions appropriately with a normal affect.     ASSESSMENT AND PLAN:  37 y.o. year old female with  1. Panic disorder  - COMPLETE METABOLIC PANEL WITH GFR - PARoxetine (PAXIL) 20 MG tablet; TAKE 1 TABLET (20 MG TOTAL) BY MOUTH EVERY MORNING.  Dispense: 30 tablet; Refill: 0 - diazepam (VALIUM) 2 MG tablet; Take 1 tablet (2 mg total) by mouth daily.  Dispense: 30 tablet; Refill: 0 We'll give her some Valium to have on hand if needed. I  discussed the Valium is long acting and offered to change to something shorter acting. However she says that the Valium does not make her groggy or drowsy. Says that it does calm her down and makes her feel normal but does not cause any adverse effects. Says in the past Xanax 1 mg "knocked her out " therefore we'll just continue the same current dose of Valium. She will continue to use it only sparingly.  2. Vitamin D deficiency Currently on over-the-counter 2000 units daily. Has been taking this dose since April 2013. - Vit D  25 hydroxy (rtn osteoporosis monitoring)  3. Elevated LFTs AST and ALT were slightly elevated in April 2013. She was supposed to have followup labs but did not. However at that time she did report using a lot of Tylenol. She quit Tylenol at that time and has not been using Tylenol since. Will recheck now. - COMPLETE METABOLIC PANEL WITH GFR   Signed, [address] [place], [place], [facility] 09/24/2013 2:33 PM

## 2013-09-25 LAB — COMPLETE METABOLIC PANEL WITH GFR
AST: 27 U/L (ref 0–37)
Alkaline Phosphatase: 48 U/L (ref 39–117)
BUN: 12 mg/dL (ref 6–23)
Calcium: 9 mg/dL (ref 8.4–10.5)
Creat: 0.7 mg/dL (ref 0.50–1.10)
GFR, Est Non African American: >89 mL/min
Glucose, Bld: 91 mg/dL (ref 70–99)

## 2013-09-25 LAB — VITAMIN D 25 HYDROXY (VIT D DEFICIENCY, FRACTURES): Vit D, 25-Hydroxy: 67 ng/mL (ref 30–89)

## 2013-10-23 ENCOUNTER — Other Ambulatory Visit: Payer: Self-pay | Admitting: Physician Assistant

## 2013-10-24 NOTE — Telephone Encounter (Signed)
Just seen 09/24/13.  Last refill 10/8 14.  Refill appropriate.  One month called in.

## 2013-10-24 NOTE — Telephone Encounter (Signed)
Agree. Approved. 

## 2013-10-30 ENCOUNTER — Other Ambulatory Visit: Payer: Self-pay | Admitting: Physician Assistant

## 2013-10-30 NOTE — Telephone Encounter (Signed)
Ok to refill 

## 2013-10-31 ENCOUNTER — Other Ambulatory Visit: Payer: Self-pay | Admitting: Family Medicine

## 2013-10-31 NOTE — Telephone Encounter (Signed)
Approved For #30+2 additional refills 

## 2013-11-24 ENCOUNTER — Other Ambulatory Visit: Payer: Self-pay | Admitting: Physician Assistant

## 2013-11-24 NOTE — Telephone Encounter (Signed)
Approved for # 30 plus 2 additional refills. May fill 1st RX now,  2nd " do not fill until 12/25/2013" 3rd: " Do not fill until 01/25/2014"

## 2013-11-24 NOTE — Telephone Encounter (Signed)
Last RF 11/6 #30  Last OV 10/08  OK refill?

## 2013-11-25 NOTE — Telephone Encounter (Signed)
Rx called in as prescribed by provider.

## 2014-01-31 ENCOUNTER — Other Ambulatory Visit: Payer: Self-pay | Admitting: Physician Assistant

## 2014-01-31 ENCOUNTER — Other Ambulatory Visit: Payer: Self-pay | Admitting: Family Medicine

## 2014-02-20 ENCOUNTER — Other Ambulatory Visit: Payer: Self-pay | Admitting: Physician Assistant

## 2014-02-20 DIAGNOSIS — F410 Panic disorder [episodic paroxysmal anxiety] without agoraphobia: Secondary | ICD-10-CM

## 2014-02-20 NOTE — Telephone Encounter (Signed)
Ok to refill??  Last office visit 09/24/2013.  Last refill 11/24/2013.

## 2014-02-23 NOTE — Telephone Encounter (Signed)
RX called in .

## 2014-02-23 NOTE — Telephone Encounter (Signed)
Approved for #30+2 additional refills 

## 2014-05-14 ENCOUNTER — Other Ambulatory Visit: Payer: Self-pay | Admitting: Physician Assistant

## 2014-05-14 NOTE — Telephone Encounter (Signed)
Refill appropriate and filled per protocol. 

## 2014-05-15 ENCOUNTER — Encounter: Payer: Self-pay | Admitting: Family Medicine

## 2014-05-15 NOTE — Telephone Encounter (Signed)
Medication refill for one time only.  Patient needs to be seen.  Letter sent for patient to call and schedule 

## 2014-05-22 ENCOUNTER — Telehealth: Payer: Self-pay | Admitting: Family Medicine

## 2014-05-22 ENCOUNTER — Other Ambulatory Visit: Payer: Self-pay | Admitting: Physician Assistant

## 2014-05-22 NOTE — Telephone Encounter (Signed)
error 

## 2014-05-22 NOTE — Telephone Encounter (Signed)
Can have 10 pills and needs appointment before any more.

## 2014-05-22 NOTE — Telephone Encounter (Signed)
Last RF 02/23/14 #30 +2  Last OV 09/24/13.  Was due for 6 mth F/U in April.  Left pt message that appt is needed.

## 2014-05-25 ENCOUNTER — Ambulatory Visit: Payer: BC Managed Care – PPO | Admitting: Physician Assistant

## 2014-05-28 ENCOUNTER — Ambulatory Visit: Payer: BC Managed Care – PPO | Admitting: Physician Assistant

## 2014-09-22 ENCOUNTER — Other Ambulatory Visit: Payer: Self-pay | Admitting: Family Medicine

## 2014-09-22 ENCOUNTER — Encounter: Payer: Self-pay | Admitting: Family Medicine

## 2014-09-22 NOTE — Telephone Encounter (Signed)
Medication refill for one time only.  Patient needs to be seen.  Letter sent for patient to call and schedule 

## 2014-10-16 ENCOUNTER — Encounter: Payer: Self-pay | Admitting: Family Medicine

## 2014-10-16 ENCOUNTER — Ambulatory Visit (INDEPENDENT_AMBULATORY_CARE_PROVIDER_SITE_OTHER): Payer: BC Managed Care – PPO | Admitting: Family Medicine

## 2014-10-16 ENCOUNTER — Other Ambulatory Visit: Payer: Self-pay | Admitting: Physician Assistant

## 2014-10-16 VITALS — BP 160/100 | HR 94 | Temp 98.5°F | Resp 14 | Ht 59.0 in | Wt 104.0 lb

## 2014-10-16 DIAGNOSIS — Z79899 Other long term (current) drug therapy: Secondary | ICD-10-CM | POA: Diagnosis not present

## 2014-10-16 DIAGNOSIS — N3001 Acute cystitis with hematuria: Secondary | ICD-10-CM | POA: Diagnosis not present

## 2014-10-16 DIAGNOSIS — F410 Panic disorder [episodic paroxysmal anxiety] without agoraphobia: Secondary | ICD-10-CM

## 2014-10-16 DIAGNOSIS — R309 Painful micturition, unspecified: Secondary | ICD-10-CM

## 2014-10-16 DIAGNOSIS — E559 Vitamin D deficiency, unspecified: Secondary | ICD-10-CM

## 2014-10-16 LAB — URINALYSIS, ROUTINE W REFLEX MICROSCOPIC
BILIRUBIN URINE: NEGATIVE
Glucose, UA: NEGATIVE mg/dL
Ketones, ur: NEGATIVE mg/dL
NITRITE: NEGATIVE
PROTEIN: 100 mg/dL — AB
Specific Gravity, Urine: 1.015 (ref 1.005–1.030)
Urobilinogen, UA: 0.2 mg/dL (ref 0.0–1.0)
pH: 7 (ref 5.0–8.0)

## 2014-10-16 LAB — URINALYSIS, MICROSCOPIC ONLY
CASTS: NONE SEEN
Crystals: NONE SEEN

## 2014-10-16 MED ORDER — HYDROCODONE-ACETAMINOPHEN 5-325 MG PO TABS: 1.0000 | ORAL_TABLET | Freq: Four times a day (QID) | ORAL | Status: DC | PRN

## 2014-10-16 MED ORDER — CIPROFLOXACIN HCL 500 MG PO TABS: 500.0000 mg | ORAL_TABLET | Freq: Two times a day (BID) | ORAL | Status: DC

## 2014-10-16 MED ORDER — CEFTRIAXONE SODIUM 1 G IJ SOLR
1.0000 g | Freq: Once | INTRAMUSCULAR | Status: AC
  Administered 2014-10-16: 1 g via INTRAMUSCULAR

## 2014-10-16 NOTE — Addendum Note (Signed)
Addended by: Donne AnonPLUMMER, KIM M on: 10/16/2014 03:44 PM   Modules accepted: Orders

## 2014-10-16 NOTE — Progress Notes (Signed)
Subjective:    Patient ID: Elaine Johns, female    DOB: 1976/03/28, 38 y.o.   MRN: [id number]  HPI Patient reports 48 hours of polyuria, urinary frequency, urgency, and now severe dysuria. She denies any fever. She does report lower abdominal pain located over the bladder. She is also having some mild CVA tenderness. Her blood pressure is elevated today due to discomfort as well as to anxiety. She has a history of whitecoat syndrome. She monitors her blood pressure frequently at home and it is always less than 140/90. Urinalysis is significant today for pyuria. Patient is requesting for something for pain. Past Medical History  Diagnosis Date  . Panic disorder   . History of miscarriage   . 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 Tubal ligation     Current Outpatient Prescriptions on File Prior to Visit  Medication Sig Dispense Refill  . Cholecalciferol (VITAMIN D) 2000 UNITS CAPS Take 1 capsule by mouth daily.      . folic acid (FOLVITE) 0.5 MG tablet Take 1 mg by mouth daily.        Marland Kitchen. PARoxetine (PAXIL) 20 MG tablet TAKE 1 TABLET BY MOUTH EVERY MORNING.  30 tablet  2  . zinc gluconate 50 MG tablet Take 50 mg by mouth daily.       No current facility-administered medications on file prior to visit.   Allergies  Allergen Reactions  . Antihistamines, Chlorpheniramine-Type     Raised heart rate.   History   Social History  . Marital Status: Married    Spouse Name: N/A    Number of Children: N/A  . Years of Education: N/A   Occupational History  . Not on file.   Social History Main Topics  . Smoking status: Current Every Day Smoker -- 1.00 packs/day    Types: Cigarettes  . Smokeless tobacco: Never Used  . Alcohol Use: Yes  . Drug Use: No  . Sexual Activity: Not on file   Other Topics Concern  . Not on file   Social History Narrative  . No narrative on file      Review of Systems  All  other systems reviewed and are negative.      Objective:   Physical Exam  Vitals reviewed. Cardiovascular: Normal rate and regular rhythm.   Pulmonary/Chest: Effort normal.  Abdominal: Soft. Bowel sounds are normal. She exhibits no distension and no mass. There is tenderness. There is no rebound and no guarding.          Assessment & Plan:  Pain with urination - Plan: Urinalysis, Routine w reflex microscopic, Urine culture  Acute cystitis with hematuria - Plan: ciprofloxacin (CIPRO) 500 MG tablet, HYDROcodone-acetaminophen (NORCO) 5-325 MG per tablet, Urine culture, COMPLETE METABOLIC PANEL WITH GFR, cefTRIAXone (ROCEPHIN) injection 1 g  Patient has a urinary tract infection. Begin Cipro 500 mg by mouth twice a day for 3 days. I explained to the patient that I would be glad to give her something for pain however I believe her pain would better be treated by getting her started on antibiotics. Therefore the patient asked for an injection of an antibiotic. I gave her 1 g of Rocephin IM 1. I recommended she begin the Cipro and also start over-the-counter AZO.  I did give the patient 10 tablets of Norco just in case but I asked her not to fill the medication and try the antibiotics  first.

## 2014-10-17 LAB — COMPLETE METABOLIC PANEL WITH GFR
ALT: 19 U/L (ref 0–35)
AST: 28 U/L (ref 0–37)
Albumin: 4.2 g/dL (ref 3.5–5.2)
Alkaline Phosphatase: 51 U/L (ref 39–117)
BUN: 6 mg/dL (ref 6–23)
CO2: 23 mEq/L (ref 19–32)
CREATININE: 0.77 mg/dL (ref 0.50–1.10)
Calcium: 8.6 mg/dL (ref 8.4–10.5)
Chloride: 105 mEq/L (ref 96–112)
GFR, Est Non African American: >89 mL/min
Glucose, Bld: 83 mg/dL (ref 70–99)
Potassium: 4.1 mEq/L (ref 3.5–5.3)
Sodium: 140 mEq/L (ref 135–145)
Total Bilirubin: 0.3 mg/dL (ref 0.2–1.2)
Total Protein: 6.5 g/dL (ref 6.0–8.3)

## 2014-10-17 LAB — LIPID PANEL
CHOLESTEROL: 222 mg/dL — AB (ref 0–200)
HDL: 76 mg/dL (ref >39)
LDL Cholesterol: 122 mg/dL — ABNORMAL HIGH (ref 0–99)
Total CHOL/HDL Ratio: 2.9 Ratio
Triglycerides: 118 mg/dL (ref <150)
VLDL: 24 mg/dL (ref 0–40)

## 2014-10-17 LAB — CBC WITH DIFFERENTIAL/PLATELET
BASOS ABS: 0 10*3/uL (ref 0.0–0.1)
BASOS PCT: 0 % (ref 0–1)
Eosinophils Absolute: 0.1 10*3/uL (ref 0.0–0.7)
Eosinophils Relative: 1 % (ref 0–5)
HCT: 38.2 % (ref 36.0–46.0)
Hemoglobin: 13 g/dL (ref 12.0–15.0)
LYMPHS PCT: 14 % (ref 12–46)
Lymphs Abs: 1.5 10*3/uL (ref 0.7–4.0)
MCH: 29.6 pg (ref 26.0–34.0)
MCHC: 34 g/dL (ref 30.0–36.0)
MCV: 87 fL (ref 78.0–100.0)
Monocytes Absolute: 0.6 10*3/uL (ref 0.1–1.0)
Monocytes Relative: 5 % (ref 3–12)
NEUTROS ABS: 8.8 10*3/uL — AB (ref 1.7–7.7)
NEUTROS PCT: 80 % — AB (ref 43–77)
Platelets: 258 10*3/uL (ref 150–400)
RBC: 4.39 MIL/uL (ref 3.87–5.11)
RDW: 14.3 % (ref 11.5–15.5)
WBC: 11 10*3/uL — ABNORMAL HIGH (ref 4.0–10.5)

## 2014-10-17 LAB — TSH: TSH: 0.891 u[IU]/mL (ref 0.350–4.500)

## 2014-10-17 LAB — VITAMIN D 25 HYDROXY (VIT D DEFICIENCY, FRACTURES): Vit D, 25-Hydroxy: 77 ng/mL (ref 30–89)

## 2014-10-19 LAB — URINE CULTURE

## 2014-10-20 ENCOUNTER — Encounter: Payer: Self-pay | Admitting: Family Medicine

## 2014-11-20 ENCOUNTER — Other Ambulatory Visit: Payer: Self-pay | Admitting: Family Medicine

## 2015-06-18 ENCOUNTER — Other Ambulatory Visit: Payer: Self-pay | Admitting: Family Medicine

## 2015-06-18 MED ORDER — PAROXETINE HCL 20 MG PO TABS: 20.0000 mg | ORAL_TABLET | Freq: Every morning | ORAL | Status: DC

## 2015-06-18 NOTE — Telephone Encounter (Signed)
[place].  Has  ot been able to find new PCP yet.  Agreed to refill Paxil for 3 months but then must find new PCP.

## 2015-12-21 ENCOUNTER — Encounter: Payer: Self-pay | Admitting: Family Medicine

## 2015-12-21 ENCOUNTER — Ambulatory Visit (INDEPENDENT_AMBULATORY_CARE_PROVIDER_SITE_OTHER): Payer: BLUE CROSS/BLUE SHIELD | Admitting: Family Medicine

## 2015-12-21 VITALS — BP 142/84 | HR 102 | Temp 99.0°F | Resp 16 | Ht 59.0 in | Wt 118.0 lb

## 2015-12-21 DIAGNOSIS — F411 Generalized anxiety disorder: Secondary | ICD-10-CM

## 2015-12-21 DIAGNOSIS — F410 Panic disorder [episodic paroxysmal anxiety] without agoraphobia: Secondary | ICD-10-CM | POA: Diagnosis not present

## 2015-12-21 MED ORDER — ALPRAZOLAM 0.5 MG PO TABS: 0.5000 mg | ORAL_TABLET | Freq: Two times a day (BID) | ORAL | Status: DC | PRN

## 2015-12-21 MED ORDER — PAROXETINE HCL 20 MG PO TABS: 20.0000 mg | ORAL_TABLET | Freq: Two times a day (BID) | ORAL | Status: DC

## 2015-12-21 NOTE — Progress Notes (Signed)
Subjective:    Patient ID: Elaine Johns, female    DOB: Aug 10, 1976, 40 y.o.   MRN: [id number]  HPI The patient had been doing well for more than 2 years on Paxil alone. She was not requiring any type of sedative.  Unfortunately, the husband lost his job and the family were forced to move to [place]. The patient had to leave behind her 3 grown daughters. She also had to leave behind all of her friends and her social support. Currently she is in [place]. She is not working. She feels isolated and alone. She is now having daily panic attacks. It is gotten to the point that she is afraid to leave the home. She is avoiding crowds. She has not been able to establish her social support yet primarily due to the anxiety. Denies any suicidal thoughts or ideation. She denies any depression it is mainly anxiety. Unfortunately she sees no ability to move back home in the near future Past Medical History  Diagnosis Date  . Panic disorder   . History of miscarriage   . 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 Tubal ligation     Current Outpatient Prescriptions on File Prior to Visit  Medication Sig Dispense Refill  . Cholecalciferol (VITAMIN D) 2000 UNITS CAPS Take 1 capsule by mouth daily.    . folic acid (FOLVITE) 0.5 MG tablet Take 1 mg by mouth daily.      Marland Kitchen. PARoxetine (PAXIL) 20 MG tablet Take 1 tablet (20 mg total) by mouth every morning. 30 tablet 2  . zinc gluconate 50 MG tablet Take 50 mg by mouth daily.     No current facility-administered medications on file prior to visit.   Allergies  Allergen Reactions  . Antihistamines, Chlorpheniramine-Type     Raised heart rate.   Social History   Social History  . Marital Status: Married    Spouse Name: N/A  . Number of Children: N/A  . Years of Education: N/A   Occupational History  . Not on file.   Social History Main Topics  . Smoking status: Current Every Day Smoker  -- 1.00 packs/day    Types: Cigarettes  . Smokeless tobacco: Never Used  . Alcohol Use: Yes  . Drug Use: No  . Sexual Activity: Not on file   Other Topics Concern  . Not on file   Social History Narrative     Review of Systems  All other systems reviewed and are negative.      Objective:   Physical Exam  Constitutional: She appears well-developed and well-nourished. No distress.  Neck: Neck supple. No thyromegaly present.  Cardiovascular: Normal rate, regular rhythm and normal heart sounds.   Pulmonary/Chest: Effort normal and breath sounds normal. No respiratory distress. She has no wheezes. She has no rales.  Skin: She is not diaphoretic.  Psychiatric: She has a normal mood and affect. Her behavior is normal. Judgment and thought content normal.  Vitals reviewed.         Assessment & Plan:  Panic disorder  GAD (generalized anxiety disorder)  increase Paxil to 20 mg twice a day. Temporarily use Xanax 0.5 mg, one half tablet twice daily for the next 2 weeks. Then wean back on the Xanax as the Paxil takes effect. I believe if we can just improve the patient's self-confidence improve and we can control the anxiety hopefully successful begin to snowball and this  will allow the patient to be abnormal omentum to the point that she can leave the home, find a job and get established help keep her mind occupied during the day and also help develop her social support.  Recheck in one month. The patient is trying to establish a primary care physician in [place] but we will be glad to try to help her until she is able to do so.

## 2016-01-24 ENCOUNTER — Telehealth: Payer: Self-pay | Admitting: Family Medicine

## 2016-01-24 NOTE — Telephone Encounter (Signed)
Rec'd PA request for Paxil BID dosing.  Submitted thru [facility] HYVPVM [id number].  Awaiting response

## 2016-01-24 NOTE — Telephone Encounter (Signed)
Received PA determination.   PA approved 01/24/2016- 01/23/2017.  Ref #: [id number].  Pharmacy made aware.

## 2016-01-26 ENCOUNTER — Other Ambulatory Visit: Payer: Self-pay | Admitting: Family Medicine

## 2016-01-26 NOTE — Telephone Encounter (Signed)
?   OK to Refill  

## 2016-01-27 NOTE — Telephone Encounter (Signed)
ok 

## 2016-02-22 ENCOUNTER — Other Ambulatory Visit: Payer: Self-pay | Admitting: Family Medicine

## 2016-02-22 NOTE — Telephone Encounter (Signed)
ok 

## 2016-02-22 NOTE — Telephone Encounter (Signed)
Medication called to pharmacy. 

## 2016-02-22 NOTE — Telephone Encounter (Signed)
Ok to refill??  Last office visit 12/21/2015.  Last refill 01/27/2016.

## 2016-03-23 ENCOUNTER — Other Ambulatory Visit: Payer: Self-pay | Admitting: Family Medicine

## 2016-03-23 NOTE — Telephone Encounter (Signed)
ok 

## 2016-03-23 NOTE — Telephone Encounter (Signed)
Medication called to pharmacy. 

## 2016-03-23 NOTE — Telephone Encounter (Signed)
Ok to refill??  Last office visit 12/21/2015.  Last refill 02/22/2016.

## 2016-04-24 ENCOUNTER — Other Ambulatory Visit: Payer: Self-pay | Admitting: Family Medicine

## 2016-04-24 NOTE — Telephone Encounter (Signed)
ok 

## 2016-04-24 NOTE — Telephone Encounter (Signed)
Ok to refill??  Last office visit 12/21/2015.  Last refill 03/23/2016.

## 2016-04-24 NOTE — Telephone Encounter (Signed)
Medication called to pharmacy. 

## 2016-05-24 ENCOUNTER — Other Ambulatory Visit: Payer: Self-pay | Admitting: Family Medicine

## 2016-05-24 NOTE — Telephone Encounter (Signed)
Call pt based on last note from ByramPickard, pt was going back to [place]? He was planning to wean back the xanax, and she was suppose to follow up in Feb Please figure out what is going on?

## 2016-05-24 NOTE — Telephone Encounter (Signed)
Ok to refill??  Last office visit 12/21/2015.  Last refill 04/24/2016.

## 2016-05-24 NOTE — Telephone Encounter (Signed)
Patient has moved to [place].  She was planning on coming back to see Dr Tanya NonesPickard in July.  Her new PCP wont prescribe her xanax.  Patient is working on finding another PCP.  She would just like a month supply of xanax and paxil.  Please advise.

## 2016-05-25 NOTE — Telephone Encounter (Signed)
Medication called to pharmacy. 

## 2016-05-25 NOTE — Telephone Encounter (Signed)
ok 

## 2016-06-26 ENCOUNTER — Telehealth: Payer: Self-pay | Admitting: *Deleted

## 2016-06-26 MED ORDER — ALPRAZOLAM 0.5 MG PO TABS: ORAL_TABLET | ORAL | Status: DC

## 2016-06-26 NOTE — Telephone Encounter (Signed)
Medication called to pharmacy. 

## 2016-06-26 NOTE — Telephone Encounter (Signed)
ok 

## 2016-06-26 NOTE — Telephone Encounter (Signed)
Received fax from [facility] in [place] requesting refill on Xanax.   Ok to refill to [place]??  Last office visit 12/21/2015.  Last refill 05/25/2016.

## 2016-07-18 ENCOUNTER — Other Ambulatory Visit: Payer: Self-pay | Admitting: Family Medicine

## 2016-07-18 DIAGNOSIS — F411 Generalized anxiety disorder: Secondary | ICD-10-CM

## 2016-07-18 DIAGNOSIS — F410 Panic disorder [episodic paroxysmal anxiety] without agoraphobia: Secondary | ICD-10-CM

## 2016-07-26 ENCOUNTER — Telehealth: Payer: Self-pay | Admitting: Family Medicine

## 2016-07-26 NOTE — Telephone Encounter (Signed)
Requesting refill on Xanax - Ok to refill??       

## 2016-07-27 MED ORDER — ALPRAZOLAM 0.5 MG PO TABS: ORAL_TABLET | ORAL | 0 refills | Status: DC

## 2016-07-27 NOTE — Telephone Encounter (Signed)
Medication called/sent to requested pharmacy and per WTP last refill as she needs to find a MD where she is to refill her medication. Pt called and LMOVM that this was the last refill and she needed to find MD there to call her meds in for her.

## 2016-07-27 NOTE — Telephone Encounter (Signed)
ok 

## 2016-08-09 ENCOUNTER — Ambulatory Visit: Admit: 2016-08-09 | Discharge: 2016-08-09 | Payer: BLUE CROSS/BLUE SHIELD | Attending: Family Medicine

## 2016-08-09 DIAGNOSIS — R030 Elevated blood-pressure reading, without diagnosis of hypertension: Secondary | ICD-10-CM

## 2016-08-09 MED ORDER — PAROXETINE HCL 20 MG PO TABS
20 | ORAL_TABLET | Freq: Every morning | ORAL | 0 refills | Status: DC
Start: 2016-08-09 — End: 2016-11-07

## 2016-08-09 MED ORDER — AMLODIPINE BESYLATE 2.5 MG PO TABS
2.5 MG | ORAL_TABLET | Freq: Every day | ORAL | 1 refills | Status: DC
Start: 2016-08-09 — End: 2016-09-13

## 2016-08-09 NOTE — Patient Instructions (Signed)
Discussion about weight reduction, low sodium intake, exercise and diminished alcohol intake

## 2016-08-09 NOTE — Progress Notes (Signed)
Subjective:      Patient ID: Christina Marsh Sick is a 40 y.o. female.    HPI  Chief Complaint   Patient presents with   ??? Anxiety-History of panic disorder???treated with alprazolam 0.5 every 12 hours when necessary.  States she usually takes about 1-1/2 tablets daily.  More recently due to [place].  Also on paroxetine 20 mg???2 in the morning.  Has helped a lot she states.  This was prescribed by her family physician in [place].       is terffied of doctors.    ??? Establish Care-background/entire past medical,social and family history obtained    ??? Hypertension-Never been treated for hypertension.  Her first 2 pregnancies???hypertension of pregnancy.  Placed on bedrest.  Last pregnancy???no hypertension.  States her blood pressure is always high in the doctor's office.  However when she measures at a [facility] numbers places it's also high.  Denies any symptoms of severe accelerated hypertension such as headache chest pain shortness of breath etc.  Denies using any medications which might raise her blood pressure???diet pills, recreational drugs etc.  No decongestants.     ??? Irregular Menses     painful abnormal ;referred to pt's mother's gyne;     Chief complaint present illness: 40 year old white female presents accompanied by her mother for first visit.  Issues are the anxiety disorder and her hypertension.  She will see a gynecologist about her abnormal menses.  Patient also has a family history of premature colon cancer???father died [id number].  Patient had a colonoscopy when she was 29.  She is now [id number].  Will refer on return visit for colonoscopy???subject discussed with patient at this visit.    Review of Systems   Constitutional: Positive for unexpected weight change (greater than 15 pound weight gain since moving here.). Negative for fatigue and fever.   Respiratory: Negative.    Cardiovascular: Negative.    Gastrointestinal: Negative.         Does drink 3 beers a night   Endocrine: Negative.     Genitourinary: Positive for menstrual problem.   Neurological: Negative.    Psychiatric/Behavioral: Negative for dysphoric mood. The patient is nervous/anxious.        Objective:   Physical Exam   Constitutional: She is oriented to person, place, and time. She appears well-developed and well-nourished. No distress.   HENT:   Head: Normocephalic.   Mouth/Throat: Oropharynx is clear and moist.   Eyes: Conjunctivae are normal. No scleral icterus.   Neck: Neck supple. Carotid bruit is not present. No thyromegaly present.   Cardiovascular: Regular rhythm and intact distal pulses.   No extrasystoles are present. Tachycardia present.  Exam reveals no gallop and no friction rub.    Murmur (short soft sm at aortic area) heard.  Pulmonary/Chest: Effort normal and breath sounds normal. She has no wheezes. She has no rales.   Abdominal: Soft. She exhibits no abdominal bruit and no mass. There is no hepatosplenomegaly.   Musculoskeletal: She exhibits no edema.   Lymphadenopathy:     She has no cervical adenopathy.   Neurological: She is alert and oriented to person, place, and time.   Skin: Skin is warm. No pallor.   Psychiatric: She has a normal mood and affect.   Nursing note and vitals reviewed.    BP (!) 162/98   Pulse 125   Ht 5' (1.524 m)   Wt 120 lb (54.4 kg)   SpO2 98%   BMI 23.44 kg/m2  Assessment:      Christina Marsh was seen today for anxiety, establish care, hypertension and irregular menses.    Diagnoses and all orders for this visit:    Elevated blood pressure reading  -     Renal Function Panel  -     amLODIPine (NORVASC) 2.5 MG tablet; Take 1 tablet by mouth daily    Family hx of colon cancer    Chronic anxiety    Other orders  -     PARoxetine (PAXIL) 20 MG tablet; Take 2 tablets by mouth every morning           Plan:      Return in about 4 weeks (around 09/06/2016).  Patient Instructions   Discussion about weight reduction, low sodium intake, exercise and diminished alcohol intake

## 2016-08-10 LAB — RENAL FUNCTION PANEL
Albumin: 4.6 g/dL (ref 3.4–5.0)
Anion Gap: 17 — ABNORMAL HIGH (ref 3–16)
BUN: 8 mg/dL (ref 7–20)
CO2: 23 mmol/L (ref 21–32)
Calcium: 9.9 mg/dL (ref 8.3–10.6)
Chloride: 97 mmol/L — ABNORMAL LOW (ref 99–110)
Creatinine: 0.8 mg/dL (ref 0.6–1.1)
GFR African American: >60 (ref >60)
GFR Non-African American: >60 (ref >60)
Glucose: 84 mg/dL (ref 70–99)
Phosphorus: 3.4 mg/dL (ref 2.5–4.9)
Potassium: 4 mmol/L (ref 3.5–5.1)
Sodium: 137 mmol/L (ref 136–145)

## 2016-08-24 MED ORDER — ALPRAZOLAM 0.5 MG PO TABS
0.5 | ORAL_TABLET | Freq: Two times a day (BID) | ORAL | 0 refills | Status: DC | PRN
Start: 2016-08-24 — End: 2016-09-13

## 2016-08-24 NOTE — Telephone Encounter (Signed)
Requesting refill. Last seen 08-09-16, future appointment 09-04-16. Send to [facility]. Prescription was previously prescribed by dr. Broadus JohnWarren pickard in [place].

## 2016-08-24 NOTE — Telephone Encounter (Signed)
Would you like for me to have pt come in for a UDS (urine drug screen)  prior to prescribing xanax since she was a new patient on 08/09/16    Oarrs ran and scanned in as well     Pt to follow up 09/04/16

## 2016-08-25 NOTE — Telephone Encounter (Signed)
Left msg that med is at pharmacy

## 2016-09-04 ENCOUNTER — Encounter: Attending: Family Medicine

## 2016-09-06 ENCOUNTER — Encounter: Attending: Family Medicine

## 2016-09-11 NOTE — Telephone Encounter (Signed)
Patient last seen on 08/09/16. Advised to follow up 4 weeks. Next appointment none.   OARRS 08/24/16

## 2016-09-12 NOTE — Telephone Encounter (Signed)
Pt informed that she needs to make an appoitment. Pt scheduled 09/13/16

## 2016-09-13 ENCOUNTER — Ambulatory Visit: Admit: 2016-09-13 | Discharge: 2016-09-13 | Payer: BLUE CROSS/BLUE SHIELD | Attending: Family Medicine

## 2016-09-13 DIAGNOSIS — F419 Anxiety disorder, unspecified: Secondary | ICD-10-CM

## 2016-09-13 MED ORDER — AMLODIPINE BESYLATE 5 MG PO TABS
5 | ORAL_TABLET | Freq: Every day | ORAL | 2 refills | Status: DC
Start: 2016-09-13 — End: 2016-11-07

## 2016-09-13 MED ORDER — ALPRAZOLAM 0.5 MG PO TABS
0.5 | ORAL_TABLET | Freq: Two times a day (BID) | ORAL | 0 refills | Status: DC | PRN
Start: 2016-09-13 — End: 2016-10-13

## 2016-09-13 NOTE — Progress Notes (Signed)
Subjective:      Patient ID: Christina Marsh is a 40 y.o. female.    HPI  Chief Complaint   Patient presents with   ??? Hypertension-.,jsde;Denies cv/cns/claud sx     On amlodipine 2.5 mg; monitored pressures of still the little high she states.   ??? Anxiety     has not had xanx for two days and is feeling bad ; usually takes half a tablet twice a day.  Occasionally needs an extra half tablet dose.     Chief complaint and present illness: 40 year old white female presents unaccompanied for follow-up.    Review of Systems   All other systems reviewed and are negative.      Objective:   Physical Exam   Constitutional: She is oriented to person, place, and time. She appears well-developed and well-nourished. No distress.   Neck: Neck supple. Carotid bruit is not present.   Cardiovascular: Normal rate, regular rhythm and normal heart sounds.    Pulmonary/Chest: Effort normal and breath sounds normal.   Lymphadenopathy:     She has no cervical adenopathy.   Neurological: She is alert and oriented to person, place, and time.   Skin: Skin is warm.   Psychiatric: She has a normal mood and affect. Her behavior is normal.   Nursing note and vitals reviewed.    BP (!) 160/100 Comment: after 5 min in quiet room   Pulse 133   Ht 5' (1.524 m)   Wt 116 lb (52.6 kg)   SpO2 98%   BMI 22.65 kg/m2    Assessment:      Christina Marsh was seen today for hypertension and anxiety.    Diagnoses and all orders for this visit:    Anxiety  -     ALPRAZolam (XANAX) 0.5 MG tablet; Take 0.5 tablets by mouth 2 times daily as needed for Anxiety . May occasionally add a third dose of 1/2 tablet.35 tablets should last a month. May refill monthly.  -     Pain Management Drug Screen    Elevated blood pressure reading  -     amLODIPine (NORVASC) 5 MG tablet; Take 1 tablet by mouth daily    Other orders  -     Cancel: PARoxetine (PAXIL) 20 MG tablet; Take 2 tablets by mouth every morning           Plan:      .follouw  Return in about 2 months (around 11/13/2016).  There  are no Patient Instructions on file for this visit.

## 2016-09-17 LAB — PAIN MANAGEMENT DRUG SCREEN
6-Acetylmorphine: NOT DETECTED
7-aminoclonazepam: NOT DETECTED
Alprazolam: NOT DETECTED
Amphetamine: NOT DETECTED
Barbiturates: NOT DETECTED
Benzoylecgonine: NOT DETECTED
Buprenorphine: NOT DETECTED
CARISOPRODOL: NOT DETECTED
Clonazepam: NOT DETECTED
Codeine: NOT DETECTED
Creatinine, Ur: 41.2 mg/dL (ref 20.0–400.0)
Diazepam: NOT DETECTED
Fentanyl: NOT DETECTED
Hydrocodone: NOT DETECTED
Hydromorphone: NOT DETECTED
Lorazepam: NOT DETECTED
MDA: NOT DETECTED
MDEA: NOT DETECTED
MDMA, Urine: NOT DETECTED
Marijuana Metabolite: NOT DETECTED
Meperidine: NOT DETECTED
Methadone: NOT DETECTED
Methamphetamine: NOT DETECTED
Methylphenidate: NOT DETECTED
Midazolam: NOT DETECTED
Morphine: NOT DETECTED
Norbuprenorphine: NOT DETECTED
Nordiazepam: NOT DETECTED
Norfentanyl: NOT DETECTED
Norhydrocodone, Urine: NOT DETECTED
Noroxycodone: NOT DETECTED
Noroxymorphone, Urine: NOT DETECTED
OXAZEPAM: NOT DETECTED
Oxycodone: NOT DETECTED
Oxymorphone: NOT DETECTED
PCP: NOT DETECTED
Phentermine: NOT DETECTED
Propoxyphene: NOT DETECTED
TEMAZEPAM: NOT DETECTED
Tapentadol, Urine: NOT DETECTED
Tapentadol-O-Sulfate, Urine: NOT DETECTED
Tramadol: NOT DETECTED
Zolpidem: NOT DETECTED

## 2016-10-11 ENCOUNTER — Encounter

## 2016-10-11 NOTE — Telephone Encounter (Signed)
Refill Request for - Xanax    Last visit- 09/13/16    Told to follow up- 2 months 11/13/16    Pending appointment- 11/07/16    Additional Comments - 08/24/16 oarrs

## 2016-10-13 ENCOUNTER — Encounter

## 2016-10-13 NOTE — Telephone Encounter (Signed)
Can you please approve

## 2016-10-13 NOTE — Telephone Encounter (Signed)
Was refused for requesting to early. Patient notified.

## 2016-10-13 NOTE — Telephone Encounter (Signed)
Patient is asking why request to fill alprazolam was denied

## 2016-10-13 NOTE — Telephone Encounter (Signed)
Patient wants to know why is was refused? She got it 09/13/16 was told to call back in month.

## 2016-10-13 NOTE — Telephone Encounter (Signed)
My error.  She can have another 35.  But she's due for follow-up in one month

## 2016-10-13 NOTE — Telephone Encounter (Signed)
Patient last seen on 09/13/16. Advised to follow up 2 months. Next appointment 11/07/16.      OARRS 08/24/16

## 2016-10-14 MED ORDER — ALPRAZOLAM 0.5 MG PO TABS
0.5 | ORAL_TABLET | Freq: Two times a day (BID) | ORAL | 0 refills | Status: DC | PRN
Start: 2016-10-14 — End: 2016-11-07

## 2016-11-07 ENCOUNTER — Ambulatory Visit: Admit: 2016-11-07 | Discharge: 2016-11-07 | Payer: BLUE CROSS/BLUE SHIELD | Attending: Family Medicine

## 2016-11-07 DIAGNOSIS — I10 Essential (primary) hypertension: Secondary | ICD-10-CM

## 2016-11-07 MED ORDER — ALPRAZOLAM 0.5 MG PO TABS
0.5 | ORAL_TABLET | Freq: Two times a day (BID) | ORAL | 2 refills | Status: AC | PRN
Start: 2016-11-07 — End: ?

## 2016-11-07 MED ORDER — PAROXETINE HCL 20 MG PO TABS
20 | ORAL_TABLET | Freq: Two times a day (BID) | ORAL | 1 refills | Status: AC
Start: 2016-11-07 — End: ?

## 2016-11-07 MED ORDER — AMLODIPINE BESYLATE 10 MG PO TABS
10 | ORAL_TABLET | Freq: Every day | ORAL | 1 refills | Status: AC
Start: 2016-11-07 — End: ?

## 2016-11-07 NOTE — Progress Notes (Signed)
Subjective:      Patient ID: Christina MartiniKelly Marsh is a 40 y.o. female.    HPI  Chief Complaint   Patient presents with   ??? Hypertension-see #'s;Accessed old records.  Somewhat recent lipid panel???total cholesterol 222, HDL 76 and LDL 122 ;Denies cv/cns/claud sx    ??? Depression-Discussed ; sounds more like panic disorder      oaars 08/24/16     Chief complaint present illness: 40 year old white female presents in follow-up???depression/anxiety and hypertension.  Here with daughter who is visiting from [place].  Patient feels she is sleeping a little bit better.    Review of Systems   Constitutional: Negative.    Respiratory: Negative.         Still smokes       Objective:   Physical Exam   Constitutional: She appears well-developed and well-nourished.   Cardiovascular: Normal rate, normal heart sounds and intact distal pulses.    Pulmonary/Chest: Effort normal and breath sounds normal.   Musculoskeletal: She exhibits no edema.   Skin: Skin is warm.   Psychiatric: She has a normal mood and affect.   Nursing note and vitals reviewed.    BP (!) 146/96 Comment: after 5 min in quiet room   Pulse 121    Temp 97.7 ??F (36.5 ??C) (Oral)    Wt 117 lb (53.1 kg)    LMP 10/30/2016    SpO2 98%    Breastfeeding? No    BMI 22.85 kg/m??     Assessment:      Christina EndoKelly was seen today for hypertension and depression.    Diagnoses and all orders for this visit:    Essential hypertension  -     amLODIPine (NORVASC) 10 MG tablet; Take 1 tablet by mouth daily    Panic disorder  -     ALPRAZolam (XANAX) 0.5 MG tablet; Take 0.5 tablets by mouth 2 times daily as needed for Anxiety  . May add a third dose of 1/2 tablet. May refill monthly..  -     PARoxetine (PAXIL) 20 MG tablet; Take 1 tablet by mouth 2 times daily    Family hx of colon cancer  -     COLONOSCOPY W/ OR W/O BIOPSY           Plan:      Return in about 2 months (around 01/07/2017).  There are no Patient Instructions on file for this visit.

## 2017-01-29 ENCOUNTER — Encounter: Payer: Self-pay | Admitting: Family Medicine

## 2017-02-08 ENCOUNTER — Ambulatory Visit (INDEPENDENT_AMBULATORY_CARE_PROVIDER_SITE_OTHER): Payer: BLUE CROSS/BLUE SHIELD | Admitting: Family Medicine

## 2017-02-08 VITALS — BP 160/90 | HR 108 | Temp 98.1°F | Resp 16 | Ht 59.0 in | Wt 118.0 lb

## 2017-02-08 DIAGNOSIS — I10 Essential (primary) hypertension: Secondary | ICD-10-CM

## 2017-02-08 LAB — COMPLETE METABOLIC PANEL WITH GFR
ALBUMIN: 3.8 g/dL (ref 3.6–5.1)
ALK PHOS: 50 U/L (ref 33–115)
ALT: 28 U/L (ref 6–29)
AST: 33 U/L — ABNORMAL HIGH (ref 10–30)
BUN: 8 mg/dL (ref 7–25)
CALCIUM: 8.8 mg/dL (ref 8.6–10.2)
CO2: 23 mmol/L (ref 20–31)
Chloride: 101 mmol/L (ref 98–110)
Creat: 0.91 mg/dL (ref 0.50–1.10)
GFR, EST NON AFRICAN AMERICAN: 79 mL/min (ref >=60)
Glucose, Bld: 145 mg/dL — ABNORMAL HIGH (ref 70–99)
POTASSIUM: 3.4 mmol/L — AB (ref 3.5–5.3)
Sodium: 137 mmol/L (ref 135–146)
Total Bilirubin: 0.2 mg/dL (ref 0.2–1.2)
Total Protein: 6.3 g/dL (ref 6.1–8.1)

## 2017-02-08 LAB — CBC WITH DIFFERENTIAL/PLATELET
BASOS ABS: 0 {cells}/uL (ref 0–200)
Basophils Relative: 0 %
Eosinophils Absolute: 88 cells/uL (ref 15–500)
Eosinophils Relative: 1 %
HEMATOCRIT: 36.3 % (ref 35.0–45.0)
HEMOGLOBIN: 11.5 g/dL — AB (ref 12.0–15.0)
LYMPHS ABS: 1320 {cells}/uL (ref 850–3900)
Lymphocytes Relative: 15 %
MCH: 26.4 pg — AB (ref 27.0–33.0)
MCHC: 31.7 g/dL — AB (ref 32.0–36.0)
MCV: 83.4 fL (ref 80.0–100.0)
MONO ABS: 528 {cells}/uL (ref 200–950)
MPV: 10.4 fL (ref 7.5–12.5)
Monocytes Relative: 6 %
NEUTROS PCT: 78 %
Neutro Abs: 6864 cells/uL (ref 1500–7800)
Platelets: 288 10*3/uL (ref 140–400)
RBC: 4.35 MIL/uL (ref 3.80–5.10)
RDW: 16.6 % — ABNORMAL HIGH (ref 11.0–15.0)
WBC: 8.8 10*3/uL (ref 3.8–10.8)

## 2017-02-08 LAB — LIPID PANEL
CHOL/HDL RATIO: 5 ratio — AB (ref <5.0)
CHOLESTEROL: 229 mg/dL — AB (ref <200)
HDL: 46 mg/dL — ABNORMAL LOW (ref >50)
LDL Cholesterol: 159 mg/dL — ABNORMAL HIGH (ref <100)
Triglycerides: 121 mg/dL (ref <150)
VLDL: 24 mg/dL (ref <30)

## 2017-02-08 LAB — TSH: TSH: 1.82 m[IU]/L

## 2017-02-08 MED ORDER — ALPRAZOLAM 0.5 MG PO TABS: ORAL_TABLET | ORAL | 0 refills | Status: DC

## 2017-02-08 MED ORDER — LOSARTAN POTASSIUM 100 MG PO TABS: 100.0000 mg | ORAL_TABLET | Freq: Every day | ORAL | 3 refills | Status: DC

## 2017-02-08 MED ORDER — AMLODIPINE BESYLATE 10 MG PO TABS: 10.0000 mg | ORAL_TABLET | Freq: Every day | ORAL | 11 refills | Status: DC

## 2017-02-08 MED ORDER — PAROXETINE HCL 40 MG PO TABS: 40.0000 mg | ORAL_TABLET | ORAL | 11 refills | Status: DC

## 2017-02-08 NOTE — Progress Notes (Signed)
   Subjective:    Patient ID: Elaine Johns, female    DOB: 1976/04/07, 41 y.o.   MRN: [id number]  HPI Recently returned from [place] after 2 years when she separated from her husband.  Has continued paxil while there with xanax 1-2 times a day for anxiety.  Was started on amlodipine for HTN.  She states that her blood pressure has been elevated in [place] even on the medication. She also drank an excessive amount of caffeine this morning possibly explaining her pounding heart rate. She denies any chest pain shortness of breath or dyspnea on exertion. She denies any headache. Overall she is doing well. She separated, she has a strong network of social support in this area. All of her grown children live in this area as does her family Past Medical History:  Diagnosis Date  . 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 History of miscarriage   . Panic disorder    Past Surgical History:  Procedure Laterality Date  . TUBAL LIGATION     Current Outpatient Prescriptions on File Prior to Visit  Medication Sig Dispense Refill  . Cholecalciferol (VITAMIN D) 2000 UNITS CAPS Take 1 capsule by mouth daily.    . folic acid (FOLVITE) 0.5 MG tablet Take 1 mg by mouth daily.      Marland Kitchen. zinc gluconate 50 MG tablet Take 50 mg by mouth daily.     No current facility-administered medications on file prior to visit.    Allergies  Allergen Reactions  . Antihistamines, Chlorpheniramine-Type     Raised heart rate.   Social History   Social History  . Marital status: Married    Spouse name: N/A  . Number of children: N/A  . Years of education: N/A   Occupational History  . Not on file.   Social History Main Topics  . Smoking status: Current Every Day Smoker    Packs/day: 1.00    Types: Cigarettes  . Smokeless tobacco: Never Used  . Alcohol use Yes  . Drug use: No  . Sexual activity: Not on file   Other Topics Concern  . Not on file   Social History Narrative  . No narrative on  file      Review of Systems  All other systems reviewed and are negative.      Objective:   Physical Exam  Constitutional: She appears well-developed and well-nourished.  Neck: No JVD present. No thyromegaly present.  Cardiovascular: Regular rhythm.   Murmur heard. Pulmonary/Chest: Effort normal and breath sounds normal. No respiratory distress. She has no wheezes. She has no rales. She exhibits no tenderness.  Abdominal: Soft. Bowel sounds are normal. She exhibits no distension. There is no tenderness. There is no rebound and no guarding.  Lymphadenopathy:    She has no cervical adenopathy.  Vitals reviewed.         Assessment & Plan:  Benign essential HTN - Plan: CBC with Differential/Platelet, COMPLETE METABOLIC PANEL WITH GFR, Lipid panel, TSH, losartan (COZAAR) 100 MG tablet  I am really concerned by her blood pressure. I'll make no changes in her Paxil or her Xanax which she has been taking for years and seems to be working well. Continue amlodipine and add losartan 100 mg by mouth daily. Check CBC, CMP, fasting lipid panel, and TSH. Recheck blood pressure here in one week.

## 2017-02-09 ENCOUNTER — Other Ambulatory Visit: Payer: Self-pay | Admitting: Family Medicine

## 2017-02-09 MED ORDER — ATORVASTATIN CALCIUM 20 MG PO TABS: 20.0000 mg | ORAL_TABLET | Freq: Every day | ORAL | 3 refills | Status: DC

## 2017-02-13 ENCOUNTER — Ambulatory Visit: Payer: BLUE CROSS/BLUE SHIELD

## 2017-02-13 ENCOUNTER — Telehealth: Payer: Self-pay

## 2017-02-13 VITALS — BP 130/62 | HR 127

## 2017-02-13 DIAGNOSIS — Z0130 Encounter for examination of blood pressure without abnormal findings: Secondary | ICD-10-CM

## 2017-02-13 NOTE — Telephone Encounter (Signed)
Pt was seen in office today for her blood pressure check. Pt blood pressure was fine however her pulse was up to 127. Notified PCP and he suggested she come in.   Called pt to sch appointment and she stated her pulse was up because she had a lot going on with her mom being in the hospital this morning as well as other personal issues she had been dealing with. Pt states she knew her pulse was up before entering the building but that it is now down. Pt feels she does not need to be seen at this point

## 2017-03-09 ENCOUNTER — Other Ambulatory Visit: Payer: Self-pay | Admitting: Family Medicine

## 2017-03-09 NOTE — Telephone Encounter (Signed)
Ok to refill 

## 2017-03-09 NOTE — Telephone Encounter (Signed)
rx called in

## 2017-03-09 NOTE — Telephone Encounter (Signed)
ok 

## 2017-03-22 ENCOUNTER — Encounter: Payer: Self-pay | Admitting: Family Medicine

## 2017-04-07 ENCOUNTER — Other Ambulatory Visit: Payer: Self-pay | Admitting: Family Medicine

## 2017-04-09 NOTE — Telephone Encounter (Signed)
ok 

## 2017-04-09 NOTE — Telephone Encounter (Signed)
Ok to refill 

## 2017-05-31 ENCOUNTER — Encounter: Payer: Self-pay | Admitting: Family Medicine

## 2017-06-26 ENCOUNTER — Other Ambulatory Visit: Payer: BC Managed Care – PPO

## 2017-06-26 ENCOUNTER — Other Ambulatory Visit: Payer: BLUE CROSS/BLUE SHIELD

## 2017-06-26 DIAGNOSIS — I10 Essential (primary) hypertension: Secondary | ICD-10-CM

## 2017-06-26 DIAGNOSIS — Z79899 Other long term (current) drug therapy: Secondary | ICD-10-CM

## 2017-06-26 DIAGNOSIS — E785 Hyperlipidemia, unspecified: Secondary | ICD-10-CM

## 2017-06-26 LAB — CBC WITH DIFFERENTIAL/PLATELET
BASOS PCT: 0 %
Basophils Absolute: 0 cells/uL (ref 0–200)
EOS ABS: 75 {cells}/uL (ref 15–500)
Eosinophils Relative: 1 %
HCT: 35.4 % (ref 35.0–45.0)
Hemoglobin: 11.6 g/dL — ABNORMAL LOW (ref 12.0–15.0)
LYMPHS ABS: 1200 {cells}/uL (ref 850–3900)
Lymphocytes Relative: 16 %
MCH: 28.5 pg (ref 27.0–33.0)
MCHC: 32.8 g/dL (ref 32.0–36.0)
MCV: 87 fL (ref 80.0–100.0)
MONOS PCT: 6 %
MPV: 10.2 fL (ref 7.5–12.5)
Monocytes Absolute: 450 cells/uL (ref 200–950)
NEUTROS ABS: 5775 {cells}/uL (ref 1500–7800)
Neutrophils Relative %: 77 %
PLATELETS: 276 10*3/uL (ref 140–400)
RBC: 4.07 MIL/uL (ref 3.80–5.10)
RDW: 15.4 % — ABNORMAL HIGH (ref 11.0–15.0)
WBC: 7.5 10*3/uL (ref 3.8–10.8)

## 2017-06-27 LAB — COMPREHENSIVE METABOLIC PANEL
ALK PHOS: 58 U/L (ref 33–115)
ALT: 21 U/L (ref 6–29)
AST: 31 U/L — ABNORMAL HIGH (ref 10–30)
Albumin: 3.9 g/dL (ref 3.6–5.1)
BUN: 6 mg/dL — AB (ref 7–25)
CO2: 22 mmol/L (ref 20–31)
CREATININE: 0.76 mg/dL (ref 0.50–1.10)
Calcium: 8.7 mg/dL (ref 8.6–10.2)
Chloride: 106 mmol/L (ref 98–110)
Glucose, Bld: 78 mg/dL (ref 70–99)
Potassium: 4.4 mmol/L (ref 3.5–5.3)
SODIUM: 139 mmol/L (ref 135–146)
TOTAL PROTEIN: 6.3 g/dL (ref 6.1–8.1)
Total Bilirubin: 0.3 mg/dL (ref 0.2–1.2)

## 2017-06-27 LAB — LIPID PANEL
CHOLESTEROL: 194 mg/dL (ref <200)
HDL: 69 mg/dL (ref >50)
LDL Cholesterol: 104 mg/dL — ABNORMAL HIGH (ref <100)
TRIGLYCERIDES: 107 mg/dL (ref <150)
Total CHOL/HDL Ratio: 2.8 Ratio (ref <5.0)
VLDL: 21 mg/dL (ref <30)

## 2017-07-09 ENCOUNTER — Other Ambulatory Visit: Payer: Self-pay | Admitting: Family Medicine

## 2017-07-09 NOTE — Telephone Encounter (Signed)
Ok to refill 

## 2017-07-09 NOTE — Telephone Encounter (Signed)
Ok, due for ov in august.

## 2017-08-09 ENCOUNTER — Other Ambulatory Visit: Payer: Self-pay | Admitting: Family Medicine

## 2017-08-09 NOTE — Telephone Encounter (Signed)
ok 

## 2017-08-09 NOTE — Telephone Encounter (Signed)
Ok to refill 

## 2017-09-10 ENCOUNTER — Other Ambulatory Visit: Payer: Self-pay | Admitting: Family Medicine

## 2017-09-10 NOTE — Telephone Encounter (Signed)
Ok, due for ov too

## 2017-09-10 NOTE — Telephone Encounter (Signed)
Ok to refill 

## 2017-10-11 ENCOUNTER — Other Ambulatory Visit: Payer: Self-pay | Admitting: Family Medicine

## 2017-10-11 NOTE — Telephone Encounter (Signed)
Ok to refill 

## 2017-10-11 NOTE — Telephone Encounter (Signed)
Ok, due for ov.  

## 2017-10-11 NOTE — Telephone Encounter (Signed)
Medication called to pharmacy.  Letter sent.  

## 2017-10-23 ENCOUNTER — Ambulatory Visit: Payer: BC Managed Care – PPO | Admitting: Family Medicine

## 2017-10-30 ENCOUNTER — Encounter: Payer: Self-pay | Admitting: Family Medicine

## 2017-10-30 ENCOUNTER — Ambulatory Visit: Payer: BLUE CROSS/BLUE SHIELD | Admitting: Family Medicine

## 2017-10-30 VITALS — BP 132/80 | HR 120 | Temp 98.8°F | Resp 14 | Ht 59.0 in | Wt 113.0 lb

## 2017-10-30 DIAGNOSIS — Z1239 Encounter for other screening for malignant neoplasm of breast: Secondary | ICD-10-CM

## 2017-10-30 DIAGNOSIS — F410 Panic disorder [episodic paroxysmal anxiety] without agoraphobia: Secondary | ICD-10-CM

## 2017-10-30 DIAGNOSIS — Z23 Encounter for immunization: Secondary | ICD-10-CM

## 2017-10-30 DIAGNOSIS — Z1231 Encounter for screening mammogram for malignant neoplasm of breast: Secondary | ICD-10-CM | POA: Diagnosis not present

## 2017-10-30 DIAGNOSIS — I10 Essential (primary) hypertension: Secondary | ICD-10-CM

## 2017-10-30 DIAGNOSIS — Z1211 Encounter for screening for malignant neoplasm of colon: Secondary | ICD-10-CM | POA: Diagnosis not present

## 2017-10-30 NOTE — Progress Notes (Signed)
Subjective:    Patient ID: Elaine Johns, female    DOB: Jun 07, 1976, 41 y.o.   MRN: [id number]  HPI  02/08/17 Recently returned from [place] after 2 years when she separated from her husband.  Has continued paxil while there with xanax 1-2 times a day for anxiety.  Was started on amlodipine for HTN.  She states that her blood pressure has been elevated in [place] even on the medication. She also drank an excessive amount of caffeine this morning possibly explaining her pounding heart rate. She denies any chest pain shortness of breath or dyspnea on exertion. She denies any headache. Overall she is doing well. She separated, she has a strong network of social support in this area. All of her grown children live in this area as does her family.  At that time, my plan was: I am really concerned by her blood pressure. I'll make no changes in her Paxil or her Xanax which she has been taking for years and seems to be working well. Continue amlodipine and add losartan 100 mg by mouth daily. Check CBC, CMP, fasting lipid panel, and TSH. Recheck blood pressure here in one week.  10/30/17 I asked the patient to come back today for recheck because she had not been seen in 9 months.  She states that her anxiety is doing well.  She is compliant with the Paxil.  She has to take a whole Xanax in the morning because she feels extremely anxious when the day starts.  She will typically take another half of a Xanax in the afternoon or in the evening to help her calm down.  She does this on a scheduled basis.  She is not try to wean away from them in quite some time.  She is in a better place now than she was earlier this year.  She is overdue for a flu shot.  Her blood pressure today is well controlled at 132/80.  Her heart rate is elevated but she is extremely anxious in the doctor's office.  She states that her heart rate is typically well controlled at home.  She is due for a mammogram, Pap smear.  She has a family history of  a father who died from colon cancer at age 853.  She is due for a colonoscopy. Past Medical History:  Diagnosis Date  . 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 History of miscarriage   . Panic disorder    Past Surgical History:  Procedure Laterality Date  . TUBAL LIGATION     Current Outpatient Medications on File Prior to Visit  Medication Sig Dispense Refill  . ALPRAZolam (XANAX) 0.5 MG tablet TAKE 1 TABLET BY MOUTH TWICE A DAY AS NEEDED (NEED OFFICE VISIT) 60 tablet 0  . amLODipine (NORVASC) 10 MG tablet Take 1 tablet (10 mg total) by mouth daily. 30 tablet 11  . atorvastatin (LIPITOR) 20 MG tablet Take 1 tablet (20 mg total) by mouth daily. 90 tablet 3  . Cholecalciferol (VITAMIN D) 2000 UNITS CAPS Take 1 capsule by mouth daily.    Marland Kitchen. losartan (COZAAR) 100 MG tablet Take 1 tablet (100 mg total) by mouth daily. 90 tablet 3  . PARoxetine (PAXIL) 40 MG tablet Take 1 tablet (40 mg total) by mouth every morning. 30 tablet 11   No current facility-administered medications on file prior to visit.    Allergies  Allergen Reactions  . Antihistamines, Chlorpheniramine-Type     Raised heart rate.  Social History   Socioeconomic History  . Marital status: Married    Spouse name: Not on file  . Number of children: Not on file  . Years of education: Not on file  . Highest education level: Not on file  Social Needs  . Financial resource strain: Not on file  . Food insecurity - worry: Not on file  . Food insecurity - inability: Not on file  . Transportation needs - medical: Not on file  . Transportation needs - non-medical: Not on file  Occupational History  . Not on file  Tobacco Use  . Smoking status: Current Every Day Smoker    Packs/day: 1.00    Types: Cigarettes  . Smokeless tobacco: Never Used  Substance and Sexual Activity  . Alcohol use: Yes  . Drug use: No  . Sexual activity: Not on file  Other Topics Concern  . Not on file  Social History  Narrative  . Not on file      Review of Systems  All other systems reviewed and are negative.      Objective:   Physical Exam  Constitutional: She appears well-developed and well-nourished.  Neck: No JVD present. No thyromegaly present.  Cardiovascular: Regular rhythm.  Murmur heard. Pulmonary/Chest: Effort normal and breath sounds normal. No respiratory distress. She has no wheezes. She has no rales. She exhibits no tenderness.  Abdominal: Soft. Bowel sounds are normal. She exhibits no distension. There is no tenderness. There is no rebound and no guarding.  Lymphadenopathy:    She has no cervical adenopathy.  Vitals reviewed.         Assessment & Plan:  Breast cancer screening - Plan: MM Digital Screening  Colon cancer screening - Plan: Ambulatory referral to Gastroenterology  Benign essential HTN  Panic disorder Continue Paxil to manage anxiety.  I recommended that the patient try to wean off the Xanax is gradually and slowly if possible.  Also recommended smoking cessation.  I will schedule the patient for mammogram.  I will also consult GI for a colonoscopy.  Her last lab work was obtained in July and it was excellent.  I will recheck that in 6 months when she has a complete physical exam which I also recommended.  I would perform a Pap smear at her complete physical exam.

## 2017-10-30 NOTE — Addendum Note (Signed)
Addended by: Legrand RamsWILLIS, Yun Gutierrez B on: 10/30/2017 11:12 AM   Modules accepted: Orders

## 2017-11-09 ENCOUNTER — Other Ambulatory Visit: Payer: Self-pay | Admitting: Family Medicine

## 2017-11-12 NOTE — Telephone Encounter (Signed)
Medication called to pharmacy. 

## 2017-11-12 NOTE — Telephone Encounter (Signed)
Ok to refill??  Last office visit 10/30/2017.  Last refill 10/11/2017.

## 2017-11-12 NOTE — Telephone Encounter (Signed)
Okay to refill? 

## 2017-11-27 ENCOUNTER — Ambulatory Visit: Payer: Self-pay

## 2017-12-13 ENCOUNTER — Other Ambulatory Visit: Payer: Self-pay | Admitting: Family Medicine

## 2017-12-13 NOTE — Telephone Encounter (Signed)
Ok to refill??  Last office visit 10/30/2017.  Last refill 11/12/2017.

## 2018-01-09 ENCOUNTER — Other Ambulatory Visit: Payer: Self-pay | Admitting: Family Medicine

## 2018-01-10 NOTE — Telephone Encounter (Signed)
Ok to refill??  Last office visit 10/30/2017.  Last refill 12/13/2017.

## 2018-01-27 ENCOUNTER — Other Ambulatory Visit: Payer: Self-pay | Admitting: Family Medicine

## 2018-01-27 DIAGNOSIS — I10 Essential (primary) hypertension: Secondary | ICD-10-CM

## 2018-02-08 ENCOUNTER — Other Ambulatory Visit: Payer: Self-pay | Admitting: Family Medicine

## 2018-02-08 NOTE — Telephone Encounter (Signed)
Pt is requesting refill on Xanax   LOV: 10/20/17 LRF:   01/10/18

## 2018-03-11 ENCOUNTER — Other Ambulatory Visit: Payer: Self-pay | Admitting: Family Medicine

## 2018-03-11 NOTE — Telephone Encounter (Signed)
Ok to refill??  Last office visit 10/30/2017.  Last refill 02/08/2018.

## 2018-04-11 ENCOUNTER — Other Ambulatory Visit: Payer: Self-pay | Admitting: Family Medicine

## 2018-04-11 NOTE — Telephone Encounter (Signed)
Ok to refill??  Last office visit 10/30/2017.  Last refill 03/12/2018.

## 2018-05-08 ENCOUNTER — Other Ambulatory Visit: Payer: Self-pay | Admitting: Family Medicine

## 2018-05-08 NOTE — Telephone Encounter (Signed)
Ok to refill??  Last office visit 10/30/2017.  Last refill 04/11/2018.

## 2018-05-09 ENCOUNTER — Encounter: Payer: Self-pay | Admitting: *Deleted

## 2018-05-09 NOTE — Telephone Encounter (Signed)
NTBS.

## 2018-05-09 NOTE — Telephone Encounter (Signed)
Letter sent.

## 2018-05-14 ENCOUNTER — Encounter: Payer: BC Managed Care – PPO | Admitting: Family Medicine

## 2018-05-16 ENCOUNTER — Encounter: Payer: Self-pay | Admitting: Family Medicine

## 2018-05-16 ENCOUNTER — Ambulatory Visit (INDEPENDENT_AMBULATORY_CARE_PROVIDER_SITE_OTHER): Payer: BLUE CROSS/BLUE SHIELD | Admitting: Family Medicine

## 2018-05-16 VITALS — BP 146/90 | HR 112 | Temp 98.3°F | Resp 16 | Ht 59.0 in | Wt 102.0 lb

## 2018-05-16 DIAGNOSIS — Z0000 Encounter for general adult medical examination without abnormal findings: Secondary | ICD-10-CM

## 2018-05-16 DIAGNOSIS — I10 Essential (primary) hypertension: Secondary | ICD-10-CM | POA: Diagnosis not present

## 2018-05-16 DIAGNOSIS — Z23 Encounter for immunization: Secondary | ICD-10-CM

## 2018-05-16 DIAGNOSIS — F410 Panic disorder [episodic paroxysmal anxiety] without agoraphobia: Secondary | ICD-10-CM

## 2018-05-16 DIAGNOSIS — Z114 Encounter for screening for human immunodeficiency virus [HIV]: Secondary | ICD-10-CM

## 2018-05-16 MED ORDER — CLONAZEPAM 0.5 MG PO TABS: 0.5000 mg | ORAL_TABLET | Freq: Two times a day (BID) | ORAL | 1 refills | Status: DC | PRN

## 2018-05-16 NOTE — Addendum Note (Signed)
Addended by: Legrand Rams B on: 05/16/2018 12:20 PM   Modules accepted: Orders

## 2018-05-16 NOTE — Progress Notes (Signed)
Subjective:    Patient ID: Elaine Johns, female    DOB: 12/09/1976, 42 y.o.   MRN: [id number]  HPI Patient is here today for a complete physical exam.  She continues to smoke and at the present time has no desire to quit smoking.  Her daughter is pregnant.  She is due for a Tdap.  She is overdue for mammogram as well as a Pap smear.  She is also due for HIV screening.  Her blood pressure here is elevated but the patient has an element of whitecoat syndrome.  She checks her blood pressure at home using different blood pressure cuffs and finds it to be consistently 120-130/70-80.  She continues to use Xanax 1 tablet in the morning and 1 tablet in the afternoon to help manage her panic disorder.  She states she always feels anxious when she wakes up in the morning.  The Xanax helps to ease the anxiety in the morning but then she crashes in the middle of the day and still suffers from anxiety including panic attacks and generalized anxiety.  She will then have to take another half a tablet in the afternoon just to help finish the her day at work.  She seems to be on a roller coaster ride of anxiety throughout the day due to the short half-life of the Xanax.  She is taking Paxil with benefit but continues to suffer from anxiety Past Medical History:  Diagnosis Date  . 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 History of miscarriage   . Panic disorder    Past Surgical History:  Procedure Laterality Date  . TUBAL LIGATION     Current Outpatient Medications on File Prior to Visit  Medication Sig Dispense Refill  . amLODipine (NORVASC) 10 MG tablet TAKE 1 TABLET BY MOUTH EVERY DAY 30 tablet 11  . atorvastatin (LIPITOR) 20 MG tablet TAKE 1 TABLET (20 MG TOTAL) BY MOUTH DAILY. 90 tablet 3  . Cholecalciferol (VITAMIN D) 2000 UNITS CAPS Take 1 capsule by mouth daily.    Marland Kitchen losartan (COZAAR) 100 MG tablet TAKE 1 TABLET BY MOUTH EVERY DAY 90 tablet 3  . PARoxetine (PAXIL) 40 MG tablet  TAKE 1 TABLET BY MOUTH EVERY MORNING 90 tablet 3   No current facility-administered medications on file prior to visit.    Allergies  Allergen Reactions  . Antihistamines, Chlorpheniramine-Type     Raised heart rate.   Social History   Socioeconomic History  . Marital status: Married    Spouse name: Not on file  . Number of children: Not on file  . Years of education: Not on file  . Highest education level: Not on file  Occupational History  . Not on file  Social Needs  . Financial resource strain: Not on file  . Food insecurity:    Worry: Not on file    Inability: Not on file  . Transportation needs:    Medical: Not on file    Non-medical: Not on file  Tobacco Use  . Smoking status: Current Every Day Smoker    Packs/day: 1.00    Types: Cigarettes  . Smokeless tobacco: Never Used  Substance and Sexual Activity  . Alcohol use: Yes  . Drug use: No  . Sexual activity: Not on file  Lifestyle  . Physical activity:    Days per week: Not on file    Minutes per session: Not on file  . Stress: Not on file  Relationships  .  Social connections:    Talks on phone: Not on file    Gets together: Not on file    Attends religious service: Not on file    Active member of club or organization: Not on file    Attends meetings of clubs or organizations: Not on file    Relationship status: Not on file  . Intimate partner violence:    Fear of current or ex partner: Not on file    Emotionally abused: Not on file    Physically abused: Not on file    Forced sexual activity: Not on file  Other Topics Concern  . Not on file  Social History Narrative  . Not on file      Review of Systems  All other systems reviewed and are negative.      Objective:   Physical Exam  Constitutional: She is oriented to person, place, and time. She appears well-developed and well-nourished. No distress.  HENT:  Head: Normocephalic and atraumatic.  Right Ear: External ear normal.  Left Ear:  External ear normal.  Nose: Nose normal.  Mouth/Throat: Oropharynx is clear and moist. No oropharyngeal exudate.  Eyes: Pupils are equal, round, and reactive to light. Conjunctivae and EOM are normal. Right eye exhibits no discharge. Left eye exhibits no discharge. No scleral icterus.  Neck: Normal range of motion. Neck supple. No JVD present. No tracheal deviation present. No thyromegaly present.  Cardiovascular: Normal rate, regular rhythm, normal heart sounds and intact distal pulses. Exam reveals no gallop and no friction rub.  No murmur heard. Pulmonary/Chest: Effort normal and breath sounds normal. No stridor. No respiratory distress. She has no wheezes. She has no rales. She exhibits no tenderness.  Abdominal: Soft. Bowel sounds are normal. She exhibits no distension and no mass. There is no tenderness. There is no rebound and no guarding. No hernia.  Musculoskeletal: Normal range of motion. She exhibits no edema, tenderness or deformity.  Lymphadenopathy:    She has no cervical adenopathy.  Neurological: She is alert and oriented to person, place, and time. She displays normal reflexes. No cranial nerve deficit or sensory deficit. She exhibits normal muscle tone. Coordination normal.  Skin: Skin is warm. No rash noted. She is not diaphoretic. No erythema. No pallor.  Psychiatric: She has a normal mood and affect. Her behavior is normal. Judgment and thought content normal.  Vitals reviewed.         Assessment & Plan:  Benign essential HTN - Plan: clonazePAM (KLONOPIN) 0.5 MG tablet, CBC with Differential/Platelet, COMPLETE METABOLIC PANEL WITH GFR, Lipid panel  Screening for HIV (human immunodeficiency virus) - Plan: HIV antibody (with reflex)  Panic disorder  General medical exam  Blood pressure at home is well controlled.  I will make no changes in her antihypertensive medication.  I will check a CBC, CMP, fasting lipid panel along with an HIV screen.  I recommended a  mammogram and a Pap smear.  Patient will schedule this with her gynecologist later this year.  Strongly recommend smoking cessation but the patient is in pre-contemplative phase.  Consider Wellbutrin if she needs assistance, previously has not tolerated Chantix well.  Discontinue Xanax.  Replaced with Klonopin 0.5 mg p.o. twice daily for longer half-life.  Continue Paxil.  Patient received Tdap

## 2018-05-17 LAB — CBC WITH DIFFERENTIAL/PLATELET
BASOS ABS: 45 {cells}/uL (ref 0–200)
Basophils Relative: 0.4 %
EOS ABS: 45 {cells}/uL (ref 15–500)
Eosinophils Relative: 0.4 %
HCT: 37.7 % (ref 35.0–45.0)
HEMOGLOBIN: 12.6 g/dL (ref 11.7–15.5)
Lymphs Abs: 1456 cells/uL (ref 850–3900)
MCH: 27.6 pg (ref 27.0–33.0)
MCHC: 33.4 g/dL (ref 32.0–36.0)
MCV: 82.5 fL (ref 80.0–100.0)
MONOS PCT: 6.4 %
MPV: 10.7 fL (ref 7.5–12.5)
NEUTROS ABS: 8938 {cells}/uL — AB (ref 1500–7800)
NEUTROS PCT: 79.8 %
Platelets: 232 10*3/uL (ref 140–400)
RBC: 4.57 10*6/uL (ref 3.80–5.10)
RDW: 14.5 % (ref 11.0–15.0)
TOTAL LYMPHOCYTE: 13 %
WBC mixed population: 717 cells/uL (ref 200–950)
WBC: 11.2 10*3/uL — ABNORMAL HIGH (ref 3.8–10.8)

## 2018-05-17 LAB — COMPLETE METABOLIC PANEL WITH GFR
AG Ratio: 1.8 (calc) (ref 1.0–2.5)
ALBUMIN MSPROF: 4.4 g/dL (ref 3.6–5.1)
ALKALINE PHOSPHATASE (APISO): 52 U/L (ref 33–115)
ALT: 16 U/L (ref 6–29)
AST: 23 U/L (ref 10–30)
BILIRUBIN TOTAL: 0.4 mg/dL (ref 0.2–1.2)
BUN: 8 mg/dL (ref 7–25)
CO2: 23 mmol/L (ref 20–32)
Calcium: 9.2 mg/dL (ref 8.6–10.2)
Chloride: 104 mmol/L (ref 98–110)
Creat: 0.75 mg/dL (ref 0.50–1.10)
GFR, Est African American: 113 mL/min/{1.73_m2} (ref >=60)
GFR, Est Non African American: 98 mL/min/{1.73_m2} (ref >=60)
GLUCOSE: 92 mg/dL (ref 65–99)
Globulin: 2.4 g/dL (calc) (ref 1.9–3.7)
Potassium: 4.5 mmol/L (ref 3.5–5.3)
SODIUM: 139 mmol/L (ref 135–146)
Total Protein: 6.8 g/dL (ref 6.1–8.1)

## 2018-05-17 LAB — LIPID PANEL
CHOL/HDL RATIO: 2.9 (calc) (ref <5.0)
Cholesterol: 238 mg/dL — ABNORMAL HIGH (ref <200)
HDL: 81 mg/dL (ref >50)
LDL Cholesterol (Calc): 138 mg/dL (calc) — ABNORMAL HIGH
NON-HDL CHOLESTEROL (CALC): 157 mg/dL — AB (ref <130)
TRIGLYCERIDES: 93 mg/dL (ref <150)

## 2018-05-17 LAB — HIV ANTIBODY (ROUTINE TESTING W REFLEX): HIV 1&2 Ab, 4th Generation: NONREACTIVE

## 2018-07-10 ENCOUNTER — Other Ambulatory Visit: Payer: Self-pay | Admitting: Family Medicine

## 2018-07-10 DIAGNOSIS — I10 Essential (primary) hypertension: Secondary | ICD-10-CM

## 2018-07-11 NOTE — Telephone Encounter (Signed)
Ok to refill??  Last office visit/ refill 05/16/2018, #1 refills.

## 2018-07-12 ENCOUNTER — Other Ambulatory Visit: Payer: Self-pay | Admitting: Family Medicine

## 2018-07-12 DIAGNOSIS — I10 Essential (primary) hypertension: Secondary | ICD-10-CM

## 2018-07-24 ENCOUNTER — Other Ambulatory Visit: Payer: Self-pay

## 2018-07-24 ENCOUNTER — Emergency Department (HOSPITAL_COMMUNITY)
Admission: EM | Admit: 2018-07-24 | Discharge: 2018-07-24 | Disposition: A | Discharge status: Home / Self Care | Payer: BLUE CROSS/BLUE SHIELD | Attending: Emergency Medicine | Admitting: Emergency Medicine

## 2018-07-24 ENCOUNTER — Encounter (HOSPITAL_COMMUNITY): Payer: Self-pay | Admitting: Emergency Medicine

## 2018-07-24 ENCOUNTER — Emergency Department (HOSPITAL_COMMUNITY): Payer: BLUE CROSS/BLUE SHIELD

## 2018-07-24 DIAGNOSIS — F17210 Nicotine dependence, cigarettes, uncomplicated: Secondary | ICD-10-CM | POA: Diagnosis not present

## 2018-07-24 DIAGNOSIS — Y9301 Activity, walking, marching and hiking: Secondary | ICD-10-CM | POA: Insufficient documentation

## 2018-07-24 DIAGNOSIS — S99911A Unspecified injury of right ankle, initial encounter: Secondary | ICD-10-CM | POA: Diagnosis present

## 2018-07-24 DIAGNOSIS — Z79899 Other long term (current) drug therapy: Secondary | ICD-10-CM | POA: Insufficient documentation

## 2018-07-24 DIAGNOSIS — I10 Essential (primary) hypertension: Secondary | ICD-10-CM | POA: Diagnosis not present

## 2018-07-24 DIAGNOSIS — Y999 Unspecified external cause status: Secondary | ICD-10-CM | POA: Diagnosis not present

## 2018-07-24 DIAGNOSIS — Y9289 Other specified places as the place of occurrence of the external cause: Secondary | ICD-10-CM | POA: Insufficient documentation

## 2018-07-24 DIAGNOSIS — S9001XA Contusion of right ankle, initial encounter: Secondary | ICD-10-CM | POA: Insufficient documentation

## 2018-07-24 DIAGNOSIS — W19XXXA Unspecified fall, initial encounter: Secondary | ICD-10-CM

## 2018-07-24 DIAGNOSIS — X500XXA Overexertion from strenuous movement or load, initial encounter: Secondary | ICD-10-CM | POA: Insufficient documentation

## 2018-07-24 HISTORY — DX: Hyperlipidemia, unspecified: E78.5

## 2018-07-24 HISTORY — DX: Essential (primary) hypertension: I10

## 2018-07-24 MED ORDER — NAPROXEN 375 MG PO TABS
375.0000 mg | ORAL_TABLET | Freq: Two times a day (BID) | ORAL | 0 refills | Status: DC
Start: 2018-07-24 — End: 2018-12-26

## 2018-07-24 MED ORDER — OXYCODONE-ACETAMINOPHEN 5-325 MG PO TABS
1.0000 | ORAL_TABLET | Freq: Once | ORAL | Status: AC
  Administered 2018-07-24: 1 via ORAL
  Filled 2018-07-24: qty 1

## 2018-07-24 NOTE — Discharge Instructions (Signed)
Please read and follow all provided instructions.  You have been seen today for an injury to your right ankle.   Tests performed today include: An x-ray of the affected area - does NOT show any broken bones or dislocations.  Vital signs. See below for your results today.   Home care instructions: -- *PRICE in the first 24-48 hours after injury: Protect (with brace, splint, sling), if given by your provider Rest Ice- Do not apply ice pack directly to your skin, place towel or similar between your skin and ice/ice pack. Apply ice for 20 min, then remove for 40 min while awake Compression- Wear brace, elastic bandage, splint as directed by your provider Elevate affected extremity above the level of your heart when not walking around for the first 24-48 hours   Medications:  - Naproxen is a nonsteroidal anti-inflammatory medication that will help with pain and swelling. Be sure to take this medication as prescribed with food, 1 pill every 12 hours,  It should be taken with food, as it can cause stomach upset, and more seriously, stomach bleeding. Do not take other nonsteroidal anti-inflammatory medications with this such as Advil, Motrin, Aleve, Mobic, Goodie Powder, or Motrin.    You make take Tylenol per over the counter dosing with these medications.   We have prescribed you new medication(s) today. Discuss the medications prescribed today with your pharmacist as they can have adverse effects and interactions with your other medicines including over the counter and prescribed medications. Seek medical evaluation if you start to experience new or abnormal symptoms after taking one of these medicines, seek care immediately if you start to experience difficulty breathing, feeling of your throat closing, facial swelling, or rash as these could be indications of a more serious allergic reaction   Follow-up instructions: Please follow-up with your primary care provider or the provided orthopedic  physician (bone specialist) if you continue to have significant pain in 1 week. In this case you may have a more severe injury that requires further care.   Return instructions:  Please return if your digits or extremity are numb or tingling, appear gray or blue, or you have severe pain (also elevate the extremity and loosen splint or wrap if you were given one) Please return if you have redness or fevers.  Please return to the Emergency Department if you experience worsening symptoms.  Please return if you have any other emergent concerns. Additional Information:  Your vital signs today were: BP 122/80 (BP Location: Right Arm)    Pulse (!) 120    Temp 98.9 F (37.2 C) (Oral)    Resp 16    Ht 4\' 11"  (1.499 m)    Wt 46.7 kg (103 lb)    LMP 07/22/2018 (Exact Date)    SpO2 97%    BMI 20.80 kg/m  If your blood pressure (BP) was elevated above 135/85 this visit, please have this repeated by your doctor within one month. ---------------

## 2018-07-24 NOTE — ED Provider Notes (Signed)
[facility] EMERGENCY DEPARTMENT Provider Note   CSN: [id number] Arrival date & time: 07/24/18  1844     History   Chief Complaint Chief Complaint  Patient presents with  . Ankle Injury    HPI Elaine Johns is a 42 y.o. female with a history of hypertension, hyperlipidemia, tobacco abuse, and panic disorder who presents to the emergency department status post mechanical fall with right ankle injury which occurred last night.  Patient states she was going outside to walk her dog and as she approached the final step her dog took off running on a leash, this resulted in her inverting the right ankle and falling to the ground.  No head injury or loss of consciousness.  No other injuries from the fall other than her right ankle.  States she has had pain, swelling, and bruising all to the lateral aspect of the right ankle which has been constant.  She rates her current pain a 8 out of 10 in severity.  She has tried ice, elevation, compression each without relief.  Denies numbness, tingling, weakness, open wounds, neck pain, or back pain.  HPI  Past Medical History:  Diagnosis Date  . 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 History of miscarriage   . Hyperlipidemia   . Hypertension   . Panic disorder     Patient Active Problem List   Diagnosis Date Noted  . Vitamin D deficiency 09/24/2013  . Panic disorder     Past Surgical History:  Procedure Laterality Date  . TUBAL LIGATION       OB History   None      Home Medications    Prior to Admission medications   Medication Sig Start Date End Date Taking? Authorizing Provider  amLODipine (NORVASC) 10 MG tablet TAKE 1 TABLET BY MOUTH EVERY DAY 01/28/18   Donita Brooks, MD  atorvastatin (LIPITOR) 20 MG tablet TAKE 1 TABLET (20 MG TOTAL) BY MOUTH DAILY. 01/28/18   Donita Brooks, MD  Cholecalciferol (VITAMIN D) 2000 UNITS CAPS Take 1 capsule by mouth daily.    [provider]    clonazePAM (KLONOPIN) 0.5 MG tablet TAKE 1 TABLET BY MOUTH TWICE A DAY AS NEEDED FOR ANXIETY 07/11/18   Donita Brooks, MD  losartan (COZAAR) 100 MG tablet TAKE 1 TABLET BY MOUTH EVERY DAY 01/28/18   Donita Brooks, MD  PARoxetine (PAXIL) 40 MG tablet TAKE 1 TABLET BY MOUTH EVERY MORNING 02/08/18   Donita Brooks, MD    Family History No family history on file.  Social History Social History   Tobacco Use  . Smoking status: Current Every Day Smoker    Packs/day: 1.00    Types: Cigarettes  . Smokeless tobacco: Never Used  Substance Use Topics  . Alcohol use: Yes  . Drug use: No     Allergies   Antihistamines, chlorpheniramine-type   Review of Systems Review of Systems  Constitutional: Negative for chills and fever.  Musculoskeletal: Positive for arthralgias and joint swelling. Negative for back pain and neck pain.  Skin: Negative for wound.  Neurological: Negative for weakness and numbness.     Physical Exam Updated Vital Signs BP 122/80 (BP Location: Right Arm)   Pulse (!) 120   Temp 98.9 F (37.2 C) (Oral)   Resp 16   Ht 4\' 11"  (1.499 m)   Wt 46.7 kg (103 lb)   LMP 07/22/2018 (Exact Date)   SpO2 97%  BMI 20.80 kg/m   Physical Exam  Constitutional: She appears well-developed and well-nourished.  Non-toxic appearance. No distress.  HENT:  Head: Normocephalic and atraumatic. Head is without raccoon's eyes and without Battle's sign.  Right Ear: No hemotympanum.  Left Ear: No hemotympanum.  Mouth/Throat: Oropharynx is clear and moist.  Eyes: Pupils are equal, round, and reactive to light. Conjunctivae and EOM are normal. Right eye exhibits no discharge. Left eye exhibits no discharge.  Neck: Normal range of motion. Neck supple. No spinous process tenderness present.  Cardiovascular: Regular rhythm. Tachycardia present.  No murmur heard. Pulses:      Dorsalis pedis pulses are 2+ on the right side, and 2+ on the left side.  Pulmonary/Chest: Breath  sounds normal. No respiratory distress. She has no wheezes. She has no rales.  Abdominal: Soft. She exhibits no distension. There is no tenderness.  Musculoskeletal:  Back: No midline tenderness or palpable step-off Upper extremities: Normal range of motion.  Nontender Lower extremities: Right lateral ankle appears to have some soft tissue swelling as well as bruising.  No open wounds.  No obvious deformity.  No erythema or warmth.  Patient has normal range of motion to bilateral hips, knees, ankles, and all digits.  She has some discomfort with right ankle plantar dorsiflexion.  She is tender to palpation over the diffuse lateral ankle including the lateral malleolus.  No other tenderness to the lower extremities.  Nontender fibular head.  No tenderness to the base of the fifth, navicular bone, or medial malleolus.  Neurological:  Alert.  Clear speech.  Sensation grossly intact bilateral upper and lower extremities.  5 out of 5 strength with plantar dorsiflexion bilaterally.  Skin: Skin is warm and dry. Capillary refill takes less than 2 seconds. No rash noted.  Psychiatric: She has a normal mood and affect. Her behavior is normal.  Nursing note and vitals reviewed.    ED Treatments / Results  Labs (all labs ordered are listed, but only abnormal results are displayed) Labs Reviewed - No data to display  EKG None  Radiology Dg Ankle Complete Right  Result Date: 07/24/2018 CLINICAL DATA:  Right ankle pain, swelling and bruising laterally after being dragged down stairs by her dog yesterday. EXAM: RIGHT ANKLE - COMPLETE 3+ VIEW COMPARISON:  None. FINDINGS: Diffuse lateral soft tissue swelling. No fracture, dislocation or gross effusion. IMPRESSION: No fracture. Electronically Signed   By: Beckie Salts M.D.   On: 07/24/2018 20:07    Procedures Procedures (including critical care time)  .Splint Application Date/Time: 07/24/2018 8:41 PM Performed by: EDT  Authorized by: Cherly Anderson, PA-C   Consent:    Consent obtained:  Verbal   Consent given by:  Patient   Risks discussed:  Discoloration, numbness, pain and swelling   Alternatives discussed:  No treatment Pre-procedure details:    Sensation:  Normal Procedure details:    Laterality:  Right   Splint type: ASO. Post-procedure details:    Sensation:  Normal   Patient tolerance of procedure:  Tolerated well, no immediate complications Medications Ordered in ED Medications - No data to display   Initial Impression / Assessment and Plan / ED Course  I have reviewed the triage vital signs and the nursing notes.  Pertinent labs & imaging results that were available during my care of the patient were reviewed by me and considered in my medical decision making (see chart for details).   Patient presents to the ED s/p mechanical fall with complaints of  R ankle pain. Patient nontoxic appearing, initial tachycardia normalized, vitals otherwise WNL. No evidence of serious head/neck/back injury s/p fall. X-ray of R ankle negative for fracture/dislocation, NVI distally. Will place in ASO with crutches. PRICE and Naproxen. I discussed results, treatment plan, need for PCP/ortho follow-up, and return precautions with the patient. Provided opportunity for questions, patient confirmed understanding and is in agreement with plan.   Vitals:   07/24/18 1901 07/24/18 2054  BP: 122/80 122/68  Pulse: (!) 120 88  Resp: 16 16  Temp: 98.9 F (37.2 C) 98.8 F (37.1 C)  SpO2: 97% 100%   Final Clinical Impressions(s) / ED Diagnoses   Final diagnoses:  Fall, initial encounter    ED Discharge Orders        Ordered    naproxen (NAPROSYN) 375 MG tablet  2 times daily     07/24/18 2037       Cherly Andersonetrucelli, Rayland Hamed R, PA-C 07/24/18 2125    Charlynne PanderYao, David Hsienta, MD 07/25/18 [phone]

## 2018-07-24 NOTE — ED Triage Notes (Signed)
Patient here from home, dog pulled her down her steps and she rolled her right ankle.  Patient states she has been able to walk on it, there is swelling and bruising noted.

## 2018-09-10 ENCOUNTER — Other Ambulatory Visit: Payer: Self-pay | Admitting: Family Medicine

## 2018-09-10 DIAGNOSIS — I10 Essential (primary) hypertension: Secondary | ICD-10-CM

## 2018-09-10 NOTE — Telephone Encounter (Signed)
Requesting refill  Klonopin  LOV: 05/16/18  LRF:  07/11/18

## 2018-11-04 ENCOUNTER — Ambulatory Visit: Payer: BLUE CROSS/BLUE SHIELD | Admitting: Family Medicine

## 2018-11-06 ENCOUNTER — Other Ambulatory Visit: Payer: Self-pay | Admitting: Family Medicine

## 2018-11-06 DIAGNOSIS — I10 Essential (primary) hypertension: Secondary | ICD-10-CM

## 2018-11-06 NOTE — Telephone Encounter (Signed)
Ok to refill??  Last office visit 05/16/2018.  Last refill 09/11/2018, #1 refill.

## 2018-12-26 ENCOUNTER — Ambulatory Visit (INDEPENDENT_AMBULATORY_CARE_PROVIDER_SITE_OTHER): Payer: BLUE CROSS/BLUE SHIELD | Admitting: Family Medicine

## 2018-12-26 ENCOUNTER — Encounter: Payer: Self-pay | Admitting: Family Medicine

## 2018-12-26 VITALS — BP 122/74 | HR 90 | Temp 98.4°F | Resp 14 | Ht 59.0 in | Wt 99.0 lb

## 2018-12-26 DIAGNOSIS — I10 Essential (primary) hypertension: Secondary | ICD-10-CM

## 2018-12-26 DIAGNOSIS — F410 Panic disorder [episodic paroxysmal anxiety] without agoraphobia: Secondary | ICD-10-CM | POA: Diagnosis not present

## 2018-12-26 DIAGNOSIS — E7800 Pure hypercholesterolemia, unspecified: Secondary | ICD-10-CM

## 2018-12-26 LAB — COMPLETE METABOLIC PANEL WITH GFR
AG Ratio: 1.7 (calc) (ref 1.0–2.5)
ALBUMIN MSPROF: 4.3 g/dL (ref 3.6–5.1)
ALT: 26 U/L (ref 6–29)
AST: 28 U/L (ref 10–30)
Alkaline phosphatase (APISO): 55 U/L (ref 33–115)
BUN: 10 mg/dL (ref 7–25)
CALCIUM: 9.5 mg/dL (ref 8.6–10.2)
CO2: 24 mmol/L (ref 20–32)
CREATININE: 0.8 mg/dL (ref 0.50–1.10)
Chloride: 106 mmol/L (ref 98–110)
GFR, EST AFRICAN AMERICAN: 105 mL/min/{1.73_m2} (ref >=60)
GFR, EST NON AFRICAN AMERICAN: 90 mL/min/{1.73_m2} (ref >=60)
GLOBULIN: 2.5 g/dL (ref 1.9–3.7)
Glucose, Bld: 100 mg/dL — ABNORMAL HIGH (ref 65–99)
Potassium: 4.1 mmol/L (ref 3.5–5.3)
SODIUM: 140 mmol/L (ref 135–146)
TOTAL PROTEIN: 6.8 g/dL (ref 6.1–8.1)
Total Bilirubin: 0.4 mg/dL (ref 0.2–1.2)

## 2018-12-26 LAB — CBC WITH DIFFERENTIAL/PLATELET
ABSOLUTE MONOCYTES: 723 {cells}/uL (ref 200–950)
Basophils Absolute: 40 cells/uL (ref 0–200)
Basophils Relative: 0.4 %
EOS PCT: 0.4 %
Eosinophils Absolute: 40 cells/uL (ref 15–500)
HEMATOCRIT: 33.4 % — AB (ref 35.0–45.0)
HEMOGLOBIN: 11.2 g/dL — AB (ref 11.7–15.5)
LYMPHS ABS: 1337 {cells}/uL (ref 850–3900)
MCH: 26.5 pg — ABNORMAL LOW (ref 27.0–33.0)
MCHC: 33.5 g/dL (ref 32.0–36.0)
MCV: 79 fL — AB (ref 80.0–100.0)
MPV: 11.6 fL (ref 7.5–12.5)
Monocytes Relative: 7.3 %
NEUTROS ABS: 7762 {cells}/uL (ref 1500–7800)
Neutrophils Relative %: 78.4 %
Platelets: 243 10*3/uL (ref 140–400)
RBC: 4.23 10*6/uL (ref 3.80–5.10)
RDW: 16.1 % — ABNORMAL HIGH (ref 11.0–15.0)
Total Lymphocyte: 13.5 %
WBC: 9.9 10*3/uL (ref 3.8–10.8)

## 2018-12-26 LAB — LIPID PANEL
CHOL/HDL RATIO: 2.5 (calc) (ref <5.0)
Cholesterol: 190 mg/dL (ref <200)
HDL: 76 mg/dL (ref >50)
LDL Cholesterol (Calc): 100 mg/dL (calc) — ABNORMAL HIGH
NON-HDL CHOLESTEROL (CALC): 114 mg/dL (ref <130)
Triglycerides: 57 mg/dL (ref <150)

## 2018-12-26 NOTE — Progress Notes (Signed)
Subjective:    Patient ID: Elaine Johns, female    DOB: 12/30/1975, 43 y.o.   MRN: [id number]  HPI Patient is here today for checkup.  From a standpoint of her panic disorder, the patient states she is doing extremely well.  The Klonopin is working much better for her than the Xanax.  His longer duration of action seems to be more consistent for her.  She is not having breakthrough anxiety 2 and 3 hours after the medicine wears off.  It tends to last throughout the day for her and helps control the panic she feels.  The majority of her panic is driven by being around people in crowds.  She works at [employer] store which obviously exacerbates this.  However she is doing extremely well on this combination and she would like to continue the Klonopin with the Paxil.  She denies any breakthrough symptoms of depression or insomnia or suicidal thoughts.  Her blood pressure today is well controlled on the combination of amlodipine and losartan.  Unfortunately she continues to smoke.  She has no desire to quit at the present time.  She is tolerating Lipitor well without any difficulty.  She denies any myalgias or right upper quadrant pain.  She is overdue for mammogram and a Pap smear as well as a flu shot.  She declines all 3 of these things today. Past Medical History:  Diagnosis Date  . 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 History of miscarriage   . Hyperlipidemia   . Hypertension   . Panic disorder    Past Surgical History:  Procedure Laterality Date  . TUBAL LIGATION     Current Outpatient Medications on File Prior to Visit  Medication Sig Dispense Refill  . amLODipine (NORVASC) 10 MG tablet TAKE 1 TABLET BY MOUTH EVERY DAY 30 tablet 11  . atorvastatin (LIPITOR) 20 MG tablet TAKE 1 TABLET (20 MG TOTAL) BY MOUTH DAILY. 90 tablet 3  . Cholecalciferol (VITAMIN D) 2000 UNITS CAPS Take 1 capsule by mouth daily.    . clonazePAM (KLONOPIN) 0.5 MG tablet TAKE 1 TABLET BY MOUTH  TWICE A DAY AS NEEDED FOR ANXIETY 60 tablet 1  . losartan (COZAAR) 100 MG tablet TAKE 1 TABLET BY MOUTH EVERY DAY 90 tablet 3  . PARoxetine (PAXIL) 40 MG tablet TAKE 1 TABLET BY MOUTH EVERY MORNING 90 tablet 3   No current facility-administered medications on file prior to visit.    Allergies  Allergen Reactions  . Antihistamines, Chlorpheniramine-Type     Raised heart rate.   Social History   Socioeconomic History  . Marital status: Married    Spouse name: Not on file  . Number of children: Not on file  . Years of education: Not on file  . Highest education level: Not on file  Occupational History  . Not on file  Social Needs  . Financial resource strain: Not on file  . Food insecurity:    Worry: Not on file    Inability: Not on file  . Transportation needs:    Medical: Not on file    Non-medical: Not on file  Tobacco Use  . Smoking status: Current Every Day Smoker    Packs/day: 1.00    Types: Cigarettes  . Smokeless tobacco: Never Used  Substance and Sexual Activity  . Alcohol use: Yes  . Drug use: No  . Sexual activity: Not on file  Lifestyle  . Physical activity:  Days per week: Not on file    Minutes per session: Not on file  . Stress: Not on file  Relationships  . Social connections:    Talks on phone: Not on file    Gets together: Not on file    Attends religious service: Not on file    Active member of club or organization: Not on file    Attends meetings of clubs or organizations: Not on file    Relationship status: Not on file  . Intimate partner violence:    Fear of current or ex partner: Not on file    Emotionally abused: Not on file    Physically abused: Not on file    Forced sexual activity: Not on file  Other Topics Concern  . Not on file  Social History Narrative  . Not on file      Review of Systems  All other systems reviewed and are negative.      Objective:   Physical Exam Vitals signs reviewed.  Constitutional:       General: She is not in acute distress.    Appearance: She is well-developed. She is not diaphoretic.  HENT:     Head: Normocephalic and atraumatic.     Right Ear: External ear normal.     Left Ear: External ear normal.     Nose: Nose normal.     Mouth/Throat:     Pharynx: No oropharyngeal exudate.  Eyes:     General: No scleral icterus.       Right eye: No discharge.        Left eye: No discharge.     Conjunctiva/sclera: Conjunctivae normal.     Pupils: Pupils are equal, round, and reactive to light.  Neck:     Musculoskeletal: Normal range of motion and neck supple.     Thyroid: No thyromegaly.     Vascular: No JVD.     Trachea: No tracheal deviation.  Cardiovascular:     Rate and Rhythm: Normal rate and regular rhythm.     Heart sounds: Normal heart sounds. No murmur. No friction rub. No gallop.   Pulmonary:     Effort: Pulmonary effort is normal. No respiratory distress.     Breath sounds: Normal breath sounds. No stridor. No wheezing or rales.  Chest:     Chest wall: No tenderness.  Abdominal:     General: Bowel sounds are normal. There is no distension.     Palpations: Abdomen is soft. There is no mass.     Tenderness: There is no abdominal tenderness. There is no guarding or rebound.     Hernia: No hernia is present.  Musculoskeletal: Normal range of motion.        General: No tenderness or deformity.  Lymphadenopathy:     Cervical: No cervical adenopathy.  Skin:    General: Skin is warm.     Coloration: Skin is not pale.     Findings: No erythema or rash.  Neurological:     Mental Status: She is alert and oriented to person, place, and time.     Cranial Nerves: No cranial nerve deficit.     Sensory: No sensory deficit.     Motor: No abnormal muscle tone.     Coordination: Coordination normal.     Deep Tendon Reflexes: Reflexes normal.  Psychiatric:        Behavior: Behavior normal.        Thought Content: Thought content normal.  Judgment: Judgment  normal.           Assessment & Plan:  Pure hypercholesterolemia - Plan: CBC with Differential/Platelet, COMPLETE METABOLIC PANEL WITH GFR, Lipid panel  Benign essential HTN  Panic disorder   Patient's blood pressure is well controlled.  I will make no changes in the amlodipine or the losartan.  I continue to encourage her to quit smoking however she is in the pre-contemplative phase.  She is taking the Lipitor daily and is consistent with taking it.  I will check a fasting lipid panel today.  Ideally I like her LDL cholesterol to be less than 130.  She denies any side effects from the medication.  Her panic disorder is now well controlled.  This is the best the patient has felt in the last few years.  Therefore we will continue the Paxil with twice a day Klonopin.  I encouraged the patient to try to wean back gradually on the Klonopin as tolerated.  I encouraged a flu shot which she declined.  I recommended a Pap smear which she declined.  I recommended scheduling a mammogram which she deferred at the present time

## 2018-12-27 ENCOUNTER — Encounter: Payer: Self-pay | Admitting: Family Medicine

## 2019-01-06 ENCOUNTER — Other Ambulatory Visit: Payer: Self-pay | Admitting: Family Medicine

## 2019-01-06 DIAGNOSIS — I10 Essential (primary) hypertension: Secondary | ICD-10-CM

## 2019-01-06 NOTE — Telephone Encounter (Signed)
Ok to refill??  Last office visit 12/26/2018.  Last refill 11/06/2018, #1 refills.

## 2019-01-24 ENCOUNTER — Other Ambulatory Visit: Payer: Self-pay | Admitting: Family Medicine

## 2019-01-24 DIAGNOSIS — I10 Essential (primary) hypertension: Secondary | ICD-10-CM

## 2019-01-29 ENCOUNTER — Other Ambulatory Visit: Payer: Self-pay | Admitting: Family Medicine

## 2019-01-31 ENCOUNTER — Other Ambulatory Visit: Payer: Self-pay | Admitting: Family Medicine

## 2019-03-08 ENCOUNTER — Other Ambulatory Visit: Payer: Self-pay | Admitting: Family Medicine

## 2019-03-08 DIAGNOSIS — I10 Essential (primary) hypertension: Secondary | ICD-10-CM

## 2019-03-10 NOTE — Telephone Encounter (Signed)
Requesting refill  Klonopin  LOV: 12/26/18  LRF:   01/06/19

## 2019-05-05 ENCOUNTER — Other Ambulatory Visit: Payer: Self-pay | Admitting: Family Medicine

## 2019-05-05 DIAGNOSIS — I10 Essential (primary) hypertension: Secondary | ICD-10-CM

## 2019-05-06 NOTE — Telephone Encounter (Signed)
Requesting refill  Clonazepam   LOV: 12/26/18  LRF:  03/10/19

## 2019-06-26 ENCOUNTER — Other Ambulatory Visit: Payer: Self-pay | Admitting: Family Medicine

## 2019-06-26 MED ORDER — LOSARTAN POTASSIUM 50 MG PO TABS: 100.0000 mg | ORAL_TABLET | Freq: Every day | ORAL | 3 refills | Status: DC

## 2019-06-26 NOTE — Telephone Encounter (Signed)
Pt called and states that the pharmacy has losartan 100mg  on back order and they can give her 2 - 50mg  tabs but needs another rx. Rx sent to pharm.

## 2019-07-04 ENCOUNTER — Other Ambulatory Visit: Payer: Self-pay | Admitting: Family Medicine

## 2019-07-04 DIAGNOSIS — I10 Essential (primary) hypertension: Secondary | ICD-10-CM

## 2019-07-04 NOTE — Telephone Encounter (Signed)
Requesting refill    Klonopin  LOV: 12/26/18  LRF: 05/06/19

## 2019-07-07 MED ORDER — CLONAZEPAM 0.5 MG PO TABS: ORAL_TABLET | ORAL | 1 refills | Status: DC

## 2019-07-27 ENCOUNTER — Other Ambulatory Visit: Payer: Self-pay | Admitting: Family Medicine

## 2019-07-31 ENCOUNTER — Telehealth: Payer: Self-pay | Admitting: Family Medicine

## 2019-07-31 NOTE — Telephone Encounter (Signed)
Given the heavy bleeding, she likely needs an endometrial biopsy.  I want her to see her GYN.  I do not want her on estrogen due to smoking and HTN, can use provera 10 mg poqday for 10 days then will have a withdrawal bleed.

## 2019-07-31 NOTE — Telephone Encounter (Signed)
Pt called and states that she has had some heavy bleeding x 3 months and she has an apt with her gyn but not until next month. She was wanting to know if you would call in something for her to stop the bleeding. She states that [facility] had called out something for her in the past.

## 2019-08-01 NOTE — Telephone Encounter (Signed)
No answer and no vm

## 2019-08-21 ENCOUNTER — Telehealth: Payer: Self-pay | Admitting: Family Medicine

## 2019-08-21 NOTE — Telephone Encounter (Signed)
Patient called in with complaints of painful urination, urinary frequency and cloudy appearance to urine. Patient states onset of symptoms was 3 days ago. Has tried AZO with no relief. I offered her and appointment and she stated that she can not get off work to come in to see [place] this week so she would like to know if we can call something in. She also stated that she has an appointment with her GYN on next Wednesday. Please advise?

## 2019-08-22 ENCOUNTER — Other Ambulatory Visit: Payer: Self-pay | Admitting: Family Medicine

## 2019-08-22 MED ORDER — CEPHALEXIN 500 MG PO CAPS: 500.0000 mg | ORAL_CAPSULE | Freq: Three times a day (TID) | ORAL | 0 refills | Status: DC

## 2019-08-22 NOTE — Telephone Encounter (Signed)
Spoke with patient and informed her that Keflex was called in. Patient verbalized understanding

## 2019-08-22 NOTE — Telephone Encounter (Signed)
I will call in keflex

## 2019-09-05 ENCOUNTER — Other Ambulatory Visit: Payer: Self-pay

## 2019-09-05 DIAGNOSIS — I10 Essential (primary) hypertension: Secondary | ICD-10-CM

## 2019-09-05 MED ORDER — CLONAZEPAM 0.5 MG PO TABS: ORAL_TABLET | ORAL | 1 refills | Status: DC

## 2019-09-05 NOTE — Telephone Encounter (Signed)
Requested Prescriptions   Pending Prescriptions Disp Refills  . clonazePAM (KLONOPIN) 0.5 MG tablet 60 tablet 1    Sig: TAKE 1 TABLET BY MOUTH TWICE A DAY AS NEEDED FOR ANXIETY    Last OV 12/26/2018  Last written 07/07/2019

## 2019-09-23 ENCOUNTER — Other Ambulatory Visit: Payer: Self-pay | Admitting: Family Medicine

## 2019-11-02 ENCOUNTER — Other Ambulatory Visit: Payer: Self-pay | Admitting: Family Medicine

## 2019-11-02 DIAGNOSIS — I10 Essential (primary) hypertension: Secondary | ICD-10-CM

## 2019-11-03 NOTE — Telephone Encounter (Signed)
Ok to refill??  Last office visit 12/26/2018.  Last refill 09/05/2019, #1 refill.

## 2019-12-01 ENCOUNTER — Other Ambulatory Visit: Payer: Self-pay | Admitting: Family Medicine

## 2019-12-29 ENCOUNTER — Other Ambulatory Visit: Payer: Self-pay | Admitting: Family Medicine

## 2019-12-29 DIAGNOSIS — I10 Essential (primary) hypertension: Secondary | ICD-10-CM

## 2019-12-29 NOTE — Telephone Encounter (Signed)
Ok to refill??  Last office visit 12/26/2018.  Last refill 11/03/2019.

## 2020-02-23 ENCOUNTER — Other Ambulatory Visit: Payer: Self-pay | Admitting: Family Medicine

## 2020-02-23 DIAGNOSIS — I10 Essential (primary) hypertension: Secondary | ICD-10-CM

## 2020-02-23 NOTE — Telephone Encounter (Signed)
Ok to refill??  Last office visit 12/26/2018.  Last refill 12/29/2019, #1 refill.   Of note, letter sent to patient to schedule OV.

## 2020-04-06 ENCOUNTER — Other Ambulatory Visit: Payer: Self-pay | Admitting: Family Medicine

## 2020-04-18 ENCOUNTER — Other Ambulatory Visit: Payer: Self-pay | Admitting: Family Medicine

## 2020-04-18 DIAGNOSIS — I10 Essential (primary) hypertension: Secondary | ICD-10-CM

## 2020-04-19 NOTE — Telephone Encounter (Signed)
Requesting refill  Klonopin  LOV:  12/26/2018  LRF:   02/23/2020

## 2020-04-21 ENCOUNTER — Other Ambulatory Visit: Payer: Self-pay | Admitting: Family Medicine

## 2020-04-21 MED ORDER — PAROXETINE HCL 40 MG PO TABS: ORAL_TABLET | ORAL | 0 refills | Status: DC

## 2020-06-05 ENCOUNTER — Other Ambulatory Visit: Payer: Self-pay | Admitting: Family Medicine

## 2020-06-07 ENCOUNTER — Emergency Department (HOSPITAL_COMMUNITY)
Admission: EM | Admit: 2020-06-07 | Discharge: 2020-06-08 | Disposition: A | Discharge status: Home / Self Care | Payer: 59 | Attending: Emergency Medicine | Admitting: Emergency Medicine

## 2020-06-07 ENCOUNTER — Encounter (HOSPITAL_COMMUNITY): Payer: Self-pay

## 2020-06-07 ENCOUNTER — Other Ambulatory Visit: Payer: Self-pay

## 2020-06-07 DIAGNOSIS — N939 Abnormal uterine and vaginal bleeding, unspecified: Secondary | ICD-10-CM | POA: Diagnosis present

## 2020-06-07 DIAGNOSIS — Z79899 Other long term (current) drug therapy: Secondary | ICD-10-CM | POA: Diagnosis not present

## 2020-06-07 DIAGNOSIS — F17210 Nicotine dependence, cigarettes, uncomplicated: Secondary | ICD-10-CM | POA: Insufficient documentation

## 2020-06-07 DIAGNOSIS — N938 Other specified abnormal uterine and vaginal bleeding: Secondary | ICD-10-CM

## 2020-06-07 LAB — BASIC METABOLIC PANEL
Anion gap: 15 (ref 5–15)
BUN: 6 mg/dL (ref 6–20)
CO2: 18 mmol/L — ABNORMAL LOW (ref 22–32)
Calcium: 8.7 mg/dL — ABNORMAL LOW (ref 8.9–10.3)
Chloride: 98 mmol/L (ref 98–111)
Creatinine, Ser: 0.89 mg/dL (ref 0.44–1.00)
GFR calc Af Amer: >60 mL/min (ref >60)
GFR calc non Af Amer: >60 mL/min (ref >60)
Glucose, Bld: 97 mg/dL (ref 70–99)
Potassium: 3 mmol/L — ABNORMAL LOW (ref 3.5–5.1)
Sodium: 131 mmol/L — ABNORMAL LOW (ref 135–145)

## 2020-06-07 LAB — CBC
HCT: 22.2 % — ABNORMAL LOW (ref 36.0–46.0)
Hemoglobin: 6.9 g/dL — CL (ref 12.0–15.0)
MCH: 25.7 pg — ABNORMAL LOW (ref 26.0–34.0)
MCHC: 31.1 g/dL (ref 30.0–36.0)
MCV: 82.8 fL (ref 80.0–100.0)
Platelets: 183 10*3/uL (ref 150–400)
RBC: 2.68 MIL/uL — ABNORMAL LOW (ref 3.87–5.11)
RDW: 16.5 % — ABNORMAL HIGH (ref 11.5–15.5)
WBC: 14.9 10*3/uL — ABNORMAL HIGH (ref 4.0–10.5)
nRBC: 0 % (ref 0.0–0.2)

## 2020-06-07 LAB — ABO/RH: ABO/RH(D): O NEG

## 2020-06-07 LAB — I-STAT BETA HCG BLOOD, ED (MC, WL, AP ONLY): I-stat hCG, quantitative: <5 m[IU]/mL (ref <5)

## 2020-06-07 LAB — TYPE AND SCREEN
ABO/RH(D): O NEG
Antibody Screen: NEGATIVE

## 2020-06-07 NOTE — ED Triage Notes (Signed)
Pt arrives to ED w/ c/o vaginal bleeding. Pt states that she has been on her period for two weeks, pt states that it has been exceptionally heavy and today she started passing clots. Pt endorses weakness and dizziness.

## 2020-06-08 ENCOUNTER — Other Ambulatory Visit: Payer: Self-pay

## 2020-06-08 MED ORDER — FERROUS SULFATE 325 (65 FE) MG PO TABS
325.0000 mg | ORAL_TABLET | Freq: Every day | ORAL | 0 refills | Status: DC
Start: 2020-06-08 — End: 2020-06-30

## 2020-06-08 MED ORDER — MEGESTROL ACETATE 40 MG PO TABS: 40.0000 mg | ORAL_TABLET | Freq: Two times a day (BID) | ORAL | 0 refills | Status: AC

## 2020-06-08 MED ORDER — SODIUM CHLORIDE 0.9 % IV BOLUS
1000.0000 mL | Freq: Once | INTRAVENOUS | Status: AC
  Administered 2020-06-08: 1000 mL via INTRAVENOUS

## 2020-06-08 MED ORDER — MEGESTROL ACETATE 40 MG PO TABS
40.0000 mg | ORAL_TABLET | Freq: Every day | ORAL | Status: DC
  Administered 2020-06-08: 40 mg via ORAL
  Filled 2020-06-08: qty 1

## 2020-06-08 MED ORDER — CLONAZEPAM 0.5 MG PO TABS
0.5000 mg | ORAL_TABLET | Freq: Once | ORAL | Status: AC
  Administered 2020-06-08: 0.5 mg via ORAL
  Filled 2020-06-08: qty 1

## 2020-06-08 NOTE — Discharge Instructions (Signed)
Take Megace to stop vaginal bleeding. You will need to take an iron supplement as well.   It is important to be seen by Dr. Normand Sloop in 1-2 days to recheck your hemoglobin and the status of your vaginal bleeding.   If you become lightheaded, have passing out episodes or feel like you're going to pass out, return to the emergency department immediately for further treatment.

## 2020-06-08 NOTE — ED Notes (Signed)
Stands for orthoVS. Minimal assistance. Steady on feet. Reports light-headed feelings and fatigue.

## 2020-06-08 NOTE — ED Notes (Signed)
Pt verbalize understanding of d/c paperwork

## 2020-06-08 NOTE — ED Provider Notes (Signed)
[facility] EMERGENCY DEPARTMENT Provider Note   CSN: [id number] Arrival date & time: 06/07/20  2134     History Chief Complaint  Patient presents with  . Vaginal Bleeding    Elaine Johns is a 44 y.o. female.  Patient to ED with heavy vaginal bleeding that started 2 weeks ago. She reports a history of irregular periods but never this heavy for this duration of time. She states she was recently seen for routine GYN exam and found to be perimenopausal. No syncope, but she became lightheaded today when she would get up from sitting. No falls, no fever.   The history is provided by the patient. No language interpreter was used.  Vaginal Bleeding Associated symptoms: fatigue   Associated symptoms: no abdominal pain, no fever and no nausea        Past Medical History:  Diagnosis Date  . 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 History of miscarriage   . Hyperlipidemia   . Hypertension   . Panic disorder     Patient Active Problem List   Diagnosis Date Noted  . Vitamin D deficiency 09/24/2013  . Panic disorder     Past Surgical History:  Procedure Laterality Date  . TUBAL LIGATION       OB History   No obstetric history on file.     No family history on file.  Social History   Tobacco Use  . Smoking status: Current Every Day Smoker    Packs/day: 1.00    Types: Cigarettes  . Smokeless tobacco: Never Used  Substance Use Topics  . Alcohol use: Yes  . Drug use: No    Home Medications Prior to Admission medications   Medication Sig Start Date End Date Taking? Authorizing Provider  amLODipine (NORVASC) 10 MG tablet TAKE 1 TABLET BY MOUTH EVERY DAY Patient taking differently: Take 10 mg by mouth daily.  09/23/19  Yes Donita Brooks, MD  Aspirin-Acetaminophen-Caffeine (GOODYS EXTRA STRENGTH) [phone] MG PACK Take 1 packet by mouth 2 (two) times daily as needed (Pain).   Yes [provider]  atorvastatin  (LIPITOR) 20 MG tablet TAKE 1 TABLET BY MOUTH EVERY DAY Patient taking differently: Take 20 mg by mouth daily.  04/19/20  Yes Donita Brooks, MD  Cholecalciferol (VITAMIN D) 2000 UNITS CAPS Take 1 capsule by mouth daily.   Yes [provider]  clonazePAM (KLONOPIN) 0.5 MG tablet TAKE 1 TABLET BY MOUTH TWICE A DAY AS NEEDED FOR ANXIETY Patient taking differently: Take 0.5 mg by mouth 2 (two) times daily as needed for anxiety.  04/19/20  Yes Donita Brooks, MD  loratadine-pseudoephedrine (CLARITIN-D 24-HOUR) 10-240 MG 24 hr tablet Take 1 tablet by mouth daily as needed for allergies.   Yes [provider]  losartan (COZAAR) 50 MG tablet TAKE 2 TABLETS BY MOUTH EVERY DAY Patient taking differently: Take 100 mg by mouth daily.  04/06/20  Yes Donita Brooks, MD  PARoxetine (PAXIL) 40 MG tablet TAKE 1 TABLET BY MOUTH EVERY DAY IN THE MORNING Patient taking differently: Take 40 mg by mouth in the morning.  04/21/20  Yes Donita Brooks, MD  cephALEXin (KEFLEX) 500 MG capsule Take 1 capsule (500 mg total) by mouth 3 (three) times daily. Patient not taking: Reported on 06/08/2020 08/22/19   Donita Brooks, MD  losartan (COZAAR) 100 MG tablet TAKE 1 TABLET BY MOUTH EVERY DAY Patient not taking: Reported on 06/08/2020 01/24/19   Pickard,  Cammie Mcgee, MD    Allergies    Antihistamines, chlorpheniramine-type  Review of Systems   Review of Systems  Constitutional: Positive for fatigue. Negative for chills and fever.  Cardiovascular: Negative.   Gastrointestinal: Negative.  Negative for abdominal pain and nausea.  Genitourinary: Positive for menstrual problem and vaginal bleeding. Negative for pelvic pain.  Musculoskeletal: Negative.   Skin: Negative.   Neurological: Positive for light-headedness. Negative for syncope.    Physical Exam Updated Vital Signs BP 115/68   Pulse (!) 108   Temp 98.2 F (36.8 C)   Resp 17   SpO2 100%   Physical Exam Vitals and nursing note reviewed.    Constitutional:      Appearance: She is well-developed.  Eyes:     Comments: Conjunctival pallor   Cardiovascular:     Rate and Rhythm: Tachycardia present.  Pulmonary:     Effort: Pulmonary effort is normal.  Abdominal:     Palpations: Abdomen is soft.     Tenderness: There is no abdominal tenderness.  Genitourinary:    Comments: Moderate vaginal blood in vault, minimal clotting.  Musculoskeletal:     Cervical back: Normal range of motion.  Skin:    General: Skin is warm and dry.  Neurological:     Mental Status: She is alert and oriented to person, place, and time.     ED Results / Procedures / Treatments   Labs (all labs ordered are listed, but only abnormal results are displayed) Labs Reviewed  CBC - Abnormal; Notable for the following components:      Result Value   WBC 14.9 (*)    RBC 2.68 (*)    Hemoglobin 6.9 (*)    HCT 22.2 (*)    MCH 25.7 (*)    RDW 16.5 (*)    All other components within normal limits  BASIC METABOLIC PANEL - Abnormal; Notable for the following components:   Sodium 131 (*)    Potassium 3.0 (*)    CO2 18 (*)    Calcium 8.7 (*)    All other components within normal limits  I-STAT BETA HCG BLOOD, ED ([facility], [facility], [facility] ONLY)  TYPE AND SCREEN  ABO/RH   Results for orders placed or performed during the hospital encounter of 06/07/20  CBC  Result Value Ref Range   WBC 14.9 (H) 4.0 - 10.5 K/uL   RBC 2.68 (L) 3.87 - 5.11 MIL/uL   Hemoglobin 6.9 (LL) 12.0 - 15.0 g/dL   HCT 22.2 (L) 36 - 46 %   MCV 82.8 80.0 - 100.0 fL   MCH 25.7 (L) 26.0 - 34.0 pg   MCHC 31.1 30.0 - 36.0 g/dL   RDW 16.5 (H) 11.5 - 15.5 %   Platelets 183 150 - 400 K/uL   nRBC 0.0 0.0 - 0.2 %  Basic metabolic panel  Result Value Ref Range   Sodium 131 (L) 135 - 145 mmol/L   Potassium 3.0 (L) 3.5 - 5.1 mmol/L   Chloride 98 98 - 111 mmol/L   CO2 18 (L) 22 - 32 mmol/L   Glucose, Bld 97 70 - 99 mg/dL   BUN 6 6 - 20 mg/dL   Creatinine, Ser 0.89 0.44 - 1.00 mg/dL   Calcium 8.7  (L) 8.9 - 10.3 mg/dL   GFR calc non Af Amer >60 >60 mL/min   GFR calc Af Amer >60 >60 mL/min   Anion gap 15 5 - 15  I-Stat beta hCG blood, ED  Result Value  Ref Range   I-stat hCG, quantitative <5.0 <5 mIU/mL   Comment 3          Type and screen [facility]  Result Value Ref Range   ABO/RH(D) O NEG    Antibody Screen NEG    Sample Expiration      06/10/2020,2359 Performed at [facility], [address], [place], [place] [address]   ABO/Rh  Result Value Ref Range   ABO/RH(D)      O NEG Performed at [facility], [address], [place], [place] [address]     EKG None  Radiology No results found.  Procedures Procedures (including critical care time)  Medications Ordered in ED Medications  megestrol (MEGACE) tablet 40 mg (has no administration in time range)  sodium chloride 0.9 % bolus 1,000 mL (1,000 mLs Intravenous Bolus from Bag 06/08/20 0307)  clonazePAM (KLONOPIN) tablet 0.5 mg (0.5 mg Oral Given 06/08/20 0150)  sodium chloride 0.9 % bolus 1,000 mL (0 mLs Intravenous Stopped 06/08/20 0307)    ED Course  I have reviewed the triage vital signs and the nursing notes.  Pertinent labs & imaging results that were available during my care of the patient were reviewed by me and considered in my medical decision making (see chart for details).    MDM Rules/Calculators/A&P                          Patient to ED with vaginal bleeding as detailed in the HPI.   She is orthostatic, tachycardic on arrival. Labs concerning for hgb 6.9, mild leukocytosis of 14.9, mild hypokalemia at 3.0. IV started, fluids ordered.   Discussed transfusion and the patient was willing but terrified of receiving blood. Discussed with Dr. Wilkie Aye. Will given aggressive fluids and reassess. If she improves, orthostatic hypotension resolves, will consider Megace, iron supplementation and close GYN follow up outpatient.   After 2 liters of fluids, the patient feels much  better. Orthostatic VS better. Tachycardia improved. Will start Megace 40 mg BID, iron supplement. Discussed potassium rich foods to supplement low potassium.  She will be discharged home and is strongly encouraged to have close GYN follow up with Dr. Normand Sloop.  Final Clinical Impression(s) / ED Diagnoses Final diagnoses:  None   1. Dysfunctional vaginal bleeding 2. Hypokalemia   Rx / DC Orders ED Discharge Orders    None       Elpidio Anis, PA-C 06/08/20 0402    Shon Baton, MD 06/09/20 [phone]

## 2020-06-10 ENCOUNTER — Ambulatory Visit: Payer: BLUE CROSS/BLUE SHIELD | Admitting: Family Medicine

## 2020-06-16 ENCOUNTER — Other Ambulatory Visit: Payer: Self-pay | Admitting: Family Medicine

## 2020-06-16 DIAGNOSIS — I10 Essential (primary) hypertension: Secondary | ICD-10-CM

## 2020-06-16 NOTE — Telephone Encounter (Signed)
Ok to refill??  Last office visit 12/26/2018.  Last refill 04/19/2020.

## 2020-06-16 NOTE — Telephone Encounter (Signed)
Pt needs OV DR. Pickard  Her last refill stated this Has not been seen since Jan She will be given 20 tablets

## 2020-06-16 NOTE — Telephone Encounter (Signed)
Letter sent.   Call placed to patient. LMTRC.

## 2020-06-24 ENCOUNTER — Other Ambulatory Visit: Payer: Self-pay | Admitting: Family Medicine

## 2020-06-24 DIAGNOSIS — I10 Essential (primary) hypertension: Secondary | ICD-10-CM

## 2020-06-26 ENCOUNTER — Other Ambulatory Visit: Payer: Self-pay | Admitting: Family Medicine

## 2020-06-26 DIAGNOSIS — I10 Essential (primary) hypertension: Secondary | ICD-10-CM

## 2020-06-28 ENCOUNTER — Other Ambulatory Visit: Payer: Self-pay

## 2020-06-28 DIAGNOSIS — I10 Essential (primary) hypertension: Secondary | ICD-10-CM

## 2020-06-28 MED ORDER — CLONAZEPAM 0.5 MG PO TABS: ORAL_TABLET | ORAL | 0 refills | Status: DC

## 2020-06-28 NOTE — Telephone Encounter (Signed)
Requested Prescriptions   Pending Prescriptions Disp Refills  . clonazePAM (KLONOPIN) 0.5 MG tablet 20 tablet 0    Sig: TAKE 1 TABLET BY MOUTH TWICE A DAY AS NEEDED FOR ANXIETY     Last OV 08/22/2019   Last written 06/16/2020

## 2020-06-29 ENCOUNTER — Other Ambulatory Visit: Payer: Self-pay

## 2020-06-29 ENCOUNTER — Ambulatory Visit (INDEPENDENT_AMBULATORY_CARE_PROVIDER_SITE_OTHER): Payer: 59 | Admitting: Family Medicine

## 2020-06-29 VITALS — BP 120/76 | HR 97 | Temp 98.4°F | Wt 102.0 lb

## 2020-06-29 DIAGNOSIS — I10 Essential (primary) hypertension: Secondary | ICD-10-CM

## 2020-06-29 DIAGNOSIS — N938 Other specified abnormal uterine and vaginal bleeding: Secondary | ICD-10-CM

## 2020-06-29 DIAGNOSIS — F410 Panic disorder [episodic paroxysmal anxiety] without agoraphobia: Secondary | ICD-10-CM | POA: Diagnosis not present

## 2020-06-29 NOTE — Progress Notes (Signed)
Subjective:    Patient ID: Elaine Johns, female    DOB: Jun 26, 1976, 44 y.o.   MRN: [id number]  HPI Patient is here today to get a refill on her Klonopin which she uses for panic disorder along with her hypertension.  She is currently taking 1 Klonopin in the morning, and a half of tablet to a whole tablet at night.  This helps manage her anxiety well coupled with the Paxil.  She denies any recent panic attacks.  She denies any depression or suicidal ideation.  She also has a history of hypertension for which she takes amlodipine along with losartan.  However reviewing her chart, I recently discovered that she was in the emergency room for vaginal bleeding.  At that time she reported heavy vaginal bleeding from her vagina.  Patient was started on Megace 40 mg tablets twice daily to stop the bleeding.  Her hemoglobin was 6.9!.  She was instructed to follow-up with her gynecologist.  She is only been taking 1 Megace tablet a day.  She is unable to see her gynecologist due to an outstanding balance.  She states that the vaginal bleeding has stopped however she is still taking the Megace and will soon discontinue the medication after completing a month of therapy which was the directions of the emergency room.  Unfortunately she continues to smoke.  Her blood pressure today is well controlled.  He is not currently taking any iron tablets. Past Medical History:  Diagnosis Date  . 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 History of miscarriage   . Hyperlipidemia   . Hypertension   . Panic disorder    Past Surgical History:  Procedure Laterality Date  . TUBAL LIGATION     Current Outpatient Medications on File Prior to Visit  Medication Sig Dispense Refill  . Aspirin-Acetaminophen-Caffeine (GOODYS EXTRA STRENGTH) 500-325-65 MG PACK Take 1 packet by mouth 2 (two) times daily as needed (Pain).    Marland Kitchen atorvastatin (LIPITOR) 20 MG tablet TAKE 1 TABLET BY MOUTH EVERY DAY (Patient  taking differently: Take 20 mg by mouth daily. ) 90 tablet 1  . cephALEXin (KEFLEX) 500 MG capsule Take 1 capsule (500 mg total) by mouth 3 (three) times daily. 15 capsule 0  . Cholecalciferol (VITAMIN D) 2000 UNITS CAPS Take 1 capsule by mouth daily.    . clonazePAM (KLONOPIN) 0.5 MG tablet TAKE 1 TABLET BY MOUTH TWICE A DAY AS NEEDED FOR ANXIETY 20 tablet 0  . ferrous sulfate 325 (65 FE) MG tablet Take 1 tablet (325 mg total) by mouth daily. 30 tablet 0  . loratadine-pseudoephedrine (CLARITIN-D 24-HOUR) 10-240 MG 24 hr tablet Take 1 tablet by mouth daily as needed for allergies.    Marland Kitchen losartan (COZAAR) 50 MG tablet TAKE 2 TABLETS BY MOUTH EVERY DAY (Patient taking differently: Take 50 mg by mouth daily. ) 180 tablet 3  . megestrol (MEGACE) 40 MG tablet Take 1 tablet (40 mg total) by mouth 2 (two) times daily. 60 tablet 0  . PARoxetine (PAXIL) 40 MG tablet Take 1 tablet (40 mg total) by mouth in the morning. 30 tablet 0   No current facility-administered medications on file prior to visit.   Allergies  Allergen Reactions  . Antihistamines, Chlorpheniramine-Type     Raised heart rate.   Social History   Socioeconomic History  . Marital status: Married    Spouse name: Not on file  . Number of children: Not on file  . Years  of education: Not on file  . Highest education level: Not on file  Occupational History  . Not on file  Tobacco Use  . Smoking status: Current Every Day Smoker    Packs/day: 1.00    Types: Cigarettes  . Smokeless tobacco: Never Used  Substance and Sexual Activity  . Alcohol use: Yes  . Drug use: No  . Sexual activity: Not on file  Other Topics Concern  . Not on file  Social History Narrative  . Not on file   Social Determinants of Health   Financial Resource Strain:   . Difficulty of Paying Living Expenses:   Food Insecurity:   . Worried About Programme researcher, broadcasting/film/video in the Last Year:   . Barista in the Last Year:   Transportation Needs:   . Sales promotion account executive (Medical):   Marland Kitchen Lack of Transportation (Non-Medical):   Physical Activity:   . Days of Exercise per Week:   . Minutes of Exercise per Session:   Stress:   . Feeling of Stress :   Social Connections:   . Frequency of Communication with Friends and Family:   . Frequency of Social Gatherings with Friends and Family:   . Attends Religious Services:   . Active Member of Clubs or Organizations:   . Attends Banker Meetings:   Marland Kitchen Marital Status:   Intimate Partner Violence:   . Fear of Current or Ex-Partner:   . Emotionally Abused:   Marland Kitchen Physically Abused:   . Sexually Abused:       Review of Systems  All other systems reviewed and are negative.      Objective:   Physical Exam Vitals reviewed.  Constitutional:      General: She is not in acute distress.    Appearance: She is well-developed. She is not diaphoretic.  HENT:     Head: Normocephalic and atraumatic.     Right Ear: External ear normal.     Left Ear: External ear normal.     Nose: Nose normal.     Mouth/Throat:     Pharynx: No oropharyngeal exudate.  Eyes:     General: No scleral icterus.       Right eye: No discharge.        Left eye: No discharge.     Conjunctiva/sclera: Conjunctivae normal.     Pupils: Pupils are equal, round, and reactive to light.  Neck:     Thyroid: No thyromegaly.     Vascular: No JVD.     Trachea: No tracheal deviation.  Cardiovascular:     Rate and Rhythm: Normal rate and regular rhythm.     Heart sounds: Normal heart sounds. No murmur heard.  No friction rub. No gallop.   Pulmonary:     Effort: Pulmonary effort is normal. No respiratory distress.     Breath sounds: Normal breath sounds. No stridor. No wheezing or rales.  Chest:     Chest wall: No tenderness.  Abdominal:     General: Bowel sounds are normal. There is no distension.     Palpations: Abdomen is soft. There is no mass.     Tenderness: There is no abdominal tenderness. There is no  guarding or rebound.     Hernia: No hernia is present.  Musculoskeletal:        General: No tenderness or deformity. Normal range of motion.     Cervical back: Normal range of motion and neck supple.  Lymphadenopathy:  Cervical: No cervical adenopathy.  Skin:    General: Skin is warm.     Coloration: Skin is not pale.     Findings: No erythema or rash.  Neurological:     Mental Status: She is alert and oriented to person, place, and time.     Cranial Nerves: No cranial nerve deficit.     Sensory: No sensory deficit.     Motor: No abnormal muscle tone.     Coordination: Coordination normal.     Deep Tendon Reflexes: Reflexes normal.  Psychiatric:        Behavior: Behavior normal.        Thought Content: Thought content normal.        Judgment: Judgment normal.           Assessment & Plan:  Benign essential HTN - Plan: CBC with Differential/Platelet, COMPLETE METABOLIC PANEL WITH GFR, Lipid panel  Panic disorder  DUB (dysfunctional uterine bleeding)  Currently her panic disorder is well controlled on the combination of Paxil and Klonopin.  I will make no changes in this.  Her blood pressure today is well controlled.  Given the recent heavy bleeding I recommended that she discontinue amlodipine until the situation stabilizes.  We will continue losartan.  I will check a CMP and a fasting lipid panel.  I encourage smoking cessation.  I will also check a CBC.  If patient's hemoglobin is not improving she will need to start an iron tablet.  We will also need to schedule the patient to see gynecology to determine a more viable long-term option such as ablation if her hemoglobin remains low.

## 2020-06-30 ENCOUNTER — Other Ambulatory Visit: Payer: Self-pay

## 2020-06-30 DIAGNOSIS — I10 Essential (primary) hypertension: Secondary | ICD-10-CM

## 2020-06-30 LAB — CBC WITH DIFFERENTIAL/PLATELET
Absolute Monocytes: 720 cells/uL (ref 200–950)
Basophils Absolute: 60 cells/uL (ref 0–200)
Basophils Relative: 0.6 %
Eosinophils Absolute: 30 cells/uL (ref 15–500)
Eosinophils Relative: 0.3 %
HCT: 24.1 % — ABNORMAL LOW (ref 35.0–45.0)
Hemoglobin: 7 g/dL — ABNORMAL LOW (ref 11.7–15.5)
Lymphs Abs: 1330 cells/uL (ref 850–3900)
MCH: 23.6 pg — ABNORMAL LOW (ref 27.0–33.0)
MCHC: 29 g/dL — ABNORMAL LOW (ref 32.0–36.0)
MCV: 81.1 fL (ref 80.0–100.0)
MPV: 11 fL (ref 7.5–12.5)
Monocytes Relative: 7.2 %
Neutro Abs: 7860 cells/uL — ABNORMAL HIGH (ref 1500–7800)
Neutrophils Relative %: 78.6 %
Platelets: 325 10*3/uL (ref 140–400)
RBC: 2.97 10*6/uL — ABNORMAL LOW (ref 3.80–5.10)
RDW: 16 % — ABNORMAL HIGH (ref 11.0–15.0)
Total Lymphocyte: 13.3 %
WBC: 10 10*3/uL (ref 3.8–10.8)

## 2020-06-30 LAB — COMPLETE METABOLIC PANEL WITH GFR
AG Ratio: 1.6 (calc) (ref 1.0–2.5)
ALT: 19 U/L (ref 6–29)
AST: 24 U/L (ref 10–35)
Albumin: 4.1 g/dL (ref 3.6–5.1)
Alkaline phosphatase (APISO): 47 U/L (ref 31–125)
BUN: 8 mg/dL (ref 7–25)
CO2: 21 mmol/L (ref 20–32)
Calcium: 8.8 mg/dL (ref 8.6–10.2)
Chloride: 107 mmol/L (ref 98–110)
Creat: 0.74 mg/dL (ref 0.50–1.10)
GFR, Est African American: 113 mL/min/{1.73_m2} (ref >=60)
GFR, Est Non African American: 98 mL/min/{1.73_m2} (ref >=60)
Globulin: 2.5 g/dL (calc) (ref 1.9–3.7)
Glucose, Bld: 76 mg/dL (ref 65–99)
Potassium: 4.6 mmol/L (ref 3.5–5.3)
Sodium: 139 mmol/L (ref 135–146)
Total Bilirubin: 0.3 mg/dL (ref 0.2–1.2)
Total Protein: 6.6 g/dL (ref 6.1–8.1)

## 2020-06-30 LAB — CBC MORPHOLOGY

## 2020-06-30 LAB — LIPID PANEL
Cholesterol: 135 mg/dL (ref <200)
HDL: 52 mg/dL (ref >=50)
LDL Cholesterol (Calc): 68 mg/dL (calc)
Non-HDL Cholesterol (Calc): 83 mg/dL (calc) (ref <130)
Total CHOL/HDL Ratio: 2.6 (calc) (ref <5.0)
Triglycerides: 71 mg/dL (ref <150)

## 2020-06-30 MED ORDER — FERROUS SULFATE 325 (65 FE) MG PO TABS: 325.0000 mg | ORAL_TABLET | Freq: Three times a day (TID) | ORAL | 1 refills | Status: DC

## 2020-06-30 NOTE — Telephone Encounter (Signed)
Hi, I received message of Patient critical value of Hgb at a 7.0  Can I leave this for Dr. Tanya Nones ?

## 2020-06-30 NOTE — Telephone Encounter (Signed)
Per Dr Tanya Nones She needs to schedule with her GYN Start iron tablets  325mg  three times a day, I sent to pharmacy  Klonopin already refilled

## 2020-07-06 ENCOUNTER — Other Ambulatory Visit: Payer: Self-pay | Admitting: Family Medicine

## 2020-07-06 DIAGNOSIS — I10 Essential (primary) hypertension: Secondary | ICD-10-CM

## 2020-07-06 DIAGNOSIS — F17200 Nicotine dependence, unspecified, uncomplicated: Secondary | ICD-10-CM | POA: Insufficient documentation

## 2020-07-06 DIAGNOSIS — E7800 Pure hypercholesterolemia, unspecified: Secondary | ICD-10-CM | POA: Insufficient documentation

## 2020-07-06 DIAGNOSIS — F419 Anxiety disorder, unspecified: Secondary | ICD-10-CM | POA: Insufficient documentation

## 2020-07-08 ENCOUNTER — Other Ambulatory Visit: Payer: Self-pay | Admitting: Family Medicine

## 2020-07-08 DIAGNOSIS — I10 Essential (primary) hypertension: Secondary | ICD-10-CM

## 2020-07-08 NOTE — Telephone Encounter (Signed)
Last refill  06/28/20 Only had a 10 day supply

## 2020-07-09 ENCOUNTER — Other Ambulatory Visit: Payer: Self-pay | Admitting: Family Medicine

## 2020-07-15 ENCOUNTER — Other Ambulatory Visit: Payer: Self-pay | Admitting: Family Medicine

## 2020-07-15 DIAGNOSIS — I10 Essential (primary) hypertension: Secondary | ICD-10-CM

## 2020-07-15 NOTE — Telephone Encounter (Signed)
Patient is requesting to resume prior quantity #60 tabs. Prescription was reduced so that patient would schedule OV to F/U. Patient had appointment on 06/29/2020.  Ok to refill?

## 2020-08-17 ENCOUNTER — Other Ambulatory Visit: Payer: Self-pay | Admitting: Family Medicine

## 2020-08-17 DIAGNOSIS — I10 Essential (primary) hypertension: Secondary | ICD-10-CM

## 2020-08-18 NOTE — Telephone Encounter (Signed)
Ok to refill Klonopin??  Last office visit 06/29/2020.  Last refill 07/15/2020.

## 2020-09-16 ENCOUNTER — Other Ambulatory Visit: Payer: Self-pay | Admitting: Family Medicine

## 2020-09-16 DIAGNOSIS — I10 Essential (primary) hypertension: Secondary | ICD-10-CM

## 2020-09-16 NOTE — Telephone Encounter (Signed)
Ok to refill??  Last office visit 06/29/2020.  Last refill 08/19/2020.

## 2020-10-14 ENCOUNTER — Other Ambulatory Visit: Payer: Self-pay | Admitting: Family Medicine

## 2020-10-14 DIAGNOSIS — I10 Essential (primary) hypertension: Secondary | ICD-10-CM

## 2020-11-12 ENCOUNTER — Other Ambulatory Visit: Payer: Self-pay | Admitting: Family Medicine

## 2020-11-12 DIAGNOSIS — I10 Essential (primary) hypertension: Secondary | ICD-10-CM

## 2020-11-15 NOTE — Telephone Encounter (Signed)
Ok to refill??  Last office visit 06/29/2020.  Last refill 10/14/2020.

## 2020-11-16 ENCOUNTER — Other Ambulatory Visit: Payer: Self-pay

## 2020-11-16 DIAGNOSIS — I10 Essential (primary) hypertension: Secondary | ICD-10-CM

## 2020-11-16 MED ORDER — CLONAZEPAM 0.5 MG PO TABS: ORAL_TABLET | ORAL | 0 refills | Status: DC

## 2020-12-02 ENCOUNTER — Other Ambulatory Visit: Payer: Self-pay | Admitting: Family Medicine

## 2020-12-13 ENCOUNTER — Other Ambulatory Visit: Payer: Self-pay | Admitting: Family Medicine

## 2020-12-13 DIAGNOSIS — I10 Essential (primary) hypertension: Secondary | ICD-10-CM

## 2020-12-14 NOTE — Telephone Encounter (Signed)
Ok to refill??  Last office visit 06/29/2020.  Last refill 11/16/2020.

## 2020-12-29 ENCOUNTER — Telehealth: Payer: Self-pay

## 2020-12-29 NOTE — Telephone Encounter (Signed)
Patient has been exposed from a coworker that tested positive of covid. Body aches, chills, vomiting mucus, stomach pain, diarrhea,  Patient is vaccinated with no booster. Made patient virtual appt 12/30/20 8am Patient also stated she does not have transportation to get tested !

## 2020-12-30 ENCOUNTER — Telehealth (INDEPENDENT_AMBULATORY_CARE_PROVIDER_SITE_OTHER): Payer: 59 | Admitting: Family Medicine

## 2020-12-30 DIAGNOSIS — J069 Acute upper respiratory infection, unspecified: Secondary | ICD-10-CM | POA: Diagnosis not present

## 2020-12-30 NOTE — Progress Notes (Signed)
Subjective:    Patient ID: Elaine Johns, female    DOB: [birth year], 45 y.o.   MRN: [id number]  HPI Patient is being seen today as a telephone visit.  She consents to be seen via telephone.  She is currently at home.  I am currently in my office.  Phone call began at 759.  Phone call concluded at 808.  Patient states that on Monday, she was exposed to a coworker who had COVID.  She states that for the last 3 days, she has experienced severe head congestion, rhinorrhea, diffuse myalgias, and a cough due to postnasal drip.  She denies any high fever.  She denies any sore throat.  She denies any nausea or vomiting or diarrhea.  She denies any chest pain or shortness of breath.  She was fully vaccinated for COVID. Past Medical History:  Diagnosis Date  . 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 Anemia    [facility]reesia 12/29/2020  . Anxiety    [facility] 12/29/2020  . History of miscarriage   . Hyperlipidemia   . Hypertension   . Panic disorder    Past Surgical History:  Procedure Laterality Date  . FRACTURE SURGERY N/A    [facility] 12/29/2020  . TUBAL LIGATION     Current Outpatient Medications on File Prior to Visit  Medication Sig Dispense Refill  . Aspirin-Acetaminophen-Caffeine (GOODYS EXTRA STRENGTH) 500-325-65 MG PACK Take 1 packet by mouth 2 (two) times daily as needed (Pain).    Marland Kitchen atorvastatin (LIPITOR) 20 MG tablet TAKE 1 TABLET BY MOUTH EVERY DAY 90 tablet 1  . cephALEXin (KEFLEX) 500 MG capsule Take 1 capsule (500 mg total) by mouth 3 (three) times daily. 15 capsule 0  . Cholecalciferol (VITAMIN D) 2000 UNITS CAPS Take 1 capsule by mouth daily.    . clonazePAM (KLONOPIN) 0.5 MG tablet TAKE 1 TABLET BY MOUTH TWICE A DAY AS NEEDED FOR ANXIETY 60 tablet 0  . losartan (COZAAR) 50 MG tablet TAKE 2 TABLETS BY MOUTH EVERY DAY (Patient taking differently: Take 50 mg by mouth daily.) 180 tablet 3  . PARoxetine (PAXIL) 40 MG tablet TAKE 1 TABLET (40 MG TOTAL) BY MOUTH  IN THE MORNING. 90 tablet 1   No current facility-administered medications on file prior to visit.   Allergies  Allergen Reactions  . Antihistamines, Chlorpheniramine-Type     Raised heart rate.   Social History   Socioeconomic History  . Marital status: Married    Spouse name: Not on file  . Number of children: Not on file  . Years of education: Not on file  . Highest education level: Not on file  Occupational History  . Not on file  Tobacco Use  . Smoking status: Current Every Day Smoker    Packs/day: 1.00    Types: Cigarettes  . Smokeless tobacco: Never Used  Substance and Sexual Activity  . Alcohol use: Yes  . Drug use: No  . Sexual activity: Not on file  Other Topics Concern  . Not on file  Social History Narrative  . Not on file   Social Determinants of Health   Financial Resource Strain: Not on file  Food Insecurity: Not on file  Transportation Needs: Not on file  Physical Activity: Not on file  Stress: Not on file  Social Connections: Not on file  Intimate Partner Violence: Not on file      Review of Systems  All other systems reviewed and are negative.  Objective:   Physical Exam        Assessment & Plan:  Viral upper respiratory tract infection - Plan: SARS-COV-2 RNA,(COVID-19) QUAL NAAT  Patient sounds audibly congested over the telephone however she is not spearing seeing any respiratory distress or chest pain or increased work of breathing.  Therefore of asked the patient to come to the office so that I can perform a COVID test.  She will call us when she gets here.  I recommended that she quarantine 10 days from symptom onset.  Otherwise I recommended supportive care including Tylenol and/or ibuprofen for fever and chills, Mucinex for cough, Coricidin for runny nose.  Also recommended zinc, vitamin D, and vitamin C.

## 2020-12-30 NOTE — Progress Notes (Signed)
illness

## 2021-01-02 LAB — SARS-COV-2 RNA,(COVID-19) QUALITATIVE NAAT: SARS CoV2 RNA: NOT DETECTED

## 2021-01-07 ENCOUNTER — Other Ambulatory Visit: Payer: Self-pay | Admitting: Family Medicine

## 2021-01-12 ENCOUNTER — Other Ambulatory Visit: Payer: Self-pay | Admitting: Family Medicine

## 2021-01-12 DIAGNOSIS — I10 Essential (primary) hypertension: Secondary | ICD-10-CM

## 2021-01-13 NOTE — Telephone Encounter (Signed)
Ok to refill??  Last office visit 12/30/2020.  Last refill 12/14/2020.  Ok to add refills to prescription?

## 2021-01-18 ENCOUNTER — Other Ambulatory Visit: Payer: Self-pay | Admitting: Family Medicine

## 2021-06-07 ENCOUNTER — Other Ambulatory Visit: Payer: Self-pay

## 2021-06-07 ENCOUNTER — Ambulatory Visit: Payer: 59 | Admitting: Family Medicine

## 2021-06-07 MED ORDER — ATORVASTATIN CALCIUM 20 MG PO TABS: 1.0000 | ORAL_TABLET | Freq: Every day | ORAL | 1 refills | Status: DC

## 2021-06-09 ENCOUNTER — Other Ambulatory Visit: Payer: Self-pay | Admitting: *Deleted

## 2021-06-09 MED ORDER — LOSARTAN POTASSIUM 50 MG PO TABS: 50.0000 mg | ORAL_TABLET | Freq: Every day | ORAL | 3 refills | Status: DC

## 2021-06-10 ENCOUNTER — Other Ambulatory Visit: Payer: Self-pay | Admitting: Family Medicine

## 2021-06-10 DIAGNOSIS — I10 Essential (primary) hypertension: Secondary | ICD-10-CM

## 2021-06-10 NOTE — Telephone Encounter (Signed)
Ok to refill??  Last office visit 12/30/2020.  Last refill 01/13/2021, #3 refills.

## 2021-07-05 ENCOUNTER — Ambulatory Visit: Payer: 59 | Admitting: Family Medicine

## 2021-07-28 ENCOUNTER — Other Ambulatory Visit: Payer: Self-pay | Admitting: Family Medicine

## 2021-09-18 ENCOUNTER — Emergency Department (HOSPITAL_COMMUNITY)
Admission: EM | Admit: 2021-09-18 | Discharge: 2021-09-18 | Disposition: A | Discharge status: Home / Self Care | Payer: 59 | Attending: Emergency Medicine | Admitting: Emergency Medicine

## 2021-09-18 ENCOUNTER — Emergency Department (HOSPITAL_COMMUNITY): Payer: 59

## 2021-09-18 ENCOUNTER — Encounter (HOSPITAL_COMMUNITY): Payer: Self-pay

## 2021-09-18 DIAGNOSIS — M79671 Pain in right foot: Secondary | ICD-10-CM | POA: Insufficient documentation

## 2021-09-18 DIAGNOSIS — F17210 Nicotine dependence, cigarettes, uncomplicated: Secondary | ICD-10-CM | POA: Insufficient documentation

## 2021-09-18 DIAGNOSIS — I10 Essential (primary) hypertension: Secondary | ICD-10-CM | POA: Insufficient documentation

## 2021-09-18 DIAGNOSIS — Z79899 Other long term (current) drug therapy: Secondary | ICD-10-CM | POA: Diagnosis not present

## 2021-09-18 DIAGNOSIS — Z7982 Long term (current) use of aspirin: Secondary | ICD-10-CM | POA: Insufficient documentation

## 2021-09-18 DIAGNOSIS — W1849XA Other slipping, tripping and stumbling without falling, initial encounter: Secondary | ICD-10-CM | POA: Insufficient documentation

## 2021-09-18 MED ORDER — OXYCODONE-ACETAMINOPHEN 5-325 MG PO TABS
1.0000 | ORAL_TABLET | ORAL | Status: DC | PRN
  Administered 2021-09-18: 1 via ORAL
  Filled 2021-09-18: qty 1

## 2021-09-18 NOTE — Progress Notes (Signed)
Orthopedic Tech Progress Note Patient Details:  Elaine Johns 1976/05/01 [id number]  Ortho Devices Type of Ortho Device: Postop shoe/boot Ortho Device/Splint Location: Rle Ortho Device/Splint Interventions: Ordered, Application, Adjustment   Post Interventions Patient Tolerated: Well Instructions Provided: Care of device, Poper ambulation with device, Adjustment of device  Livia Tarr L Jenissa Tyrell 09/18/2021, 10:07 AM

## 2021-09-18 NOTE — Discharge Instructions (Addendum)
You were seen and evaluated in the [facility] today for further evaluation of right foot pain after a trip and fall.  As we discussed, there did not appear to be any fractures or dislocations on your imaging.  I will have you follow-up with your primary care doctor for possible orthopedics follow-up.  Please ice the area for the next 72 hours and then move to heat.  Tylenol/ibuprofen for pain.  Please return to the emergency department if you experience worsening pain, severe swelling, numbness or weakness to the toes or feet, or any other concerns you may have.

## 2021-09-18 NOTE — ED Notes (Signed)
Pt being discharged by PA at triage. 

## 2021-09-18 NOTE — ED Notes (Signed)
Pt states her toe/foot has lost feeling, is more swollen, and is very numb. Triage RN notified

## 2021-09-18 NOTE — ED Provider Notes (Addendum)
[facility] EMERGENCY DEPARTMENT Provider Note   CSN: [id number] Arrival date & time: 09/18/21  0631     History Chief Complaint  Patient presents with   Foot Pain    Elaine Johns is a 45 y.o. female who presents to the emergency department for right foot pain that began last night.  She states that she was going to the bathroom when she tripped over a dog bone and her toes were plantar flexed.  Her right great toe pain is rated a moderate in severity.  She denies any weakness/numbness to the toes.  She denies any other injury.   Foot Pain      Past Medical History:  Diagnosis Date   Abnormal Pap smear and cervical HPV (human papillomavirus)    Anemia    [facility]reesia 12/29/2020   Anxiety    [facility]ia 12/29/2020   History of miscarriage    Hyperlipidemia    Hypertension    Panic disorder     Patient Active Problem List   Diagnosis Date Noted   Vitamin D deficiency 09/24/2013   Panic disorder     Past Surgical History:  Procedure Laterality Date   FRACTURE SURGERY N/A    [facility] 12/29/2020   TUBAL LIGATION       OB History   No obstetric history on file.     No family history on file.  Social History   Tobacco Use   Smoking status: Every Day    Packs/day: 1.00    Types: Cigarettes   Smokeless tobacco: Never  Substance Use Topics   Alcohol use: Yes   Drug use: No    Home Medications Prior to Admission medications   Medication Sig Start Date End Date Taking? Authorizing Provider  Aspirin-Acetaminophen-Caffeine (GOODYS EXTRA STRENGTH) [phone] MG PACK Take 1 packet by mouth 2 (two) times daily as needed (Pain).    [provider]  atorvastatin (LIPITOR) 20 MG tablet Take 1 tablet (20 mg total) by mouth daily. 06/07/21   Donita Brooks, MD  cephALEXin (KEFLEX) 500 MG capsule Take 1 capsule (500 mg total) by mouth 3 (three) times daily. 08/22/19   Donita Brooks, MD  Cholecalciferol (VITAMIN D) 2000 UNITS CAPS Take 1  capsule by mouth daily.    [provider]  clonazePAM (KLONOPIN) 0.5 MG tablet TAKE 1 TABLET BY MOUTH TWICE DAILY AS NEEDED FOR ANXIETY 06/10/21   Donita Brooks, MD  losartan (COZAAR) 50 MG tablet Take 1 tablet (50 mg total) by mouth daily. 06/09/21   Donita Brooks, MD  PARoxetine (PAXIL) 40 MG tablet TAKE 1 TABLET BY MOUTH EVERY DAY IN THE MORNING 07/28/21   Donita Brooks, MD    Allergies    Antihistamines, chlorpheniramine-type  Review of Systems   Review of Systems  All other systems reviewed and are negative.  Physical Exam Updated Vital Signs BP 131/80 (BP Location: Left Arm)   Pulse 92   Temp 98.5 F (36.9 C) (Oral)   Resp 20   SpO2 100%   Physical Exam Vitals reviewed.  Constitutional:      Appearance: Normal appearance.  HENT:     Head: Normocephalic and atraumatic.  Eyes:     General:        Right eye: No discharge.        Left eye: No discharge.     Conjunctiva/sclera: Conjunctivae normal.  Pulmonary:     Effort: Pulmonary effort is normal.  Musculoskeletal:  Comments: Swelling, tenderness, and ecchymosis over the right first MTP.  There is good cap refill in the right great toe.  Limited range of motion secondary to pain.  Normal sensation.  Dorsalis pedis pulses 2+ in the right foot.  Normal range of motion at the ankle. Compartments are soft.   Skin:    General: Skin is warm and dry.     Findings: No rash.  Neurological:     General: No focal deficit present.     Mental Status: She is alert.  Psychiatric:        Mood and Affect: Mood normal.        Behavior: Behavior normal.    ED Results / Procedures / Treatments   Labs (all labs ordered are listed, but only abnormal results are displayed) Labs Reviewed - No data to display  EKG None  Radiology DG Foot Complete Right  Result Date: 09/18/2021 CLINICAL DATA:  Pain after trauma yesterday. EXAM: RIGHT FOOT COMPLETE - 3+ VIEW COMPARISON:  None. FINDINGS: Focal degenerative  changes at the first MTP joint. No fractures or dislocations. No other abnormalities. IMPRESSION: Focal and significant degenerative changes at the first MTP joint. No fracture or dislocation. Electronically Signed   By: Gerome Sam III M.D.   On: 09/18/2021 07:46    Procedures Procedures   Medications Ordered in ED Medications  oxyCODONE-acetaminophen (PERCOCET/ROXICET) 5-325 MG per tablet 1 tablet (1 tablet Oral Given 09/18/21 8119)    ED Course  I have reviewed the triage vital signs and the nursing notes.  Pertinent labs & imaging results that were available during my care of the patient were reviewed by me and considered in my medical decision making (see chart for details).    MDM Rules/Calculators/A&P                          Elaine Johns is a 45 y.o. female who presents to the Emergency Department for further evaluation of right foot pain.  Clinically speaking she appears well.  Compartments are soft and the foot.  No evidence of compartment syndrome at this time. Xray didn't reveal any fractures or dislocations. I suspect an underlying ligamentous injury considering the mechanism of injury. I we will have her follow-up with her primary care provider for possible Ortho consult.  Postop shoe provided for comfort.  Tylenol/ibuprofen for pain and will have her ice the injury for the next 72 hours and moved to heat.  All questions concerns addressed.  Strict return precautions given.  Final Clinical Impression(s) / ED Diagnoses Final diagnoses:  Foot pain, right    Rx / DC Orders ED Discharge Orders     None        Teressa Lower, PA-C 09/18/21 0948    Teressa Lower, PA-C 09/18/21 1478    Linwood Dibbles, MD 09/19/21 [phone]

## 2021-09-18 NOTE — ED Triage Notes (Signed)
Pt states that she tripped on her dogs bone last night getting out of the shower and hurt her R foot, swelling noted

## 2021-09-18 NOTE — ED Notes (Signed)
Ortho at triage for post-op shoe and then pt to be discharged.

## 2021-10-05 ENCOUNTER — Other Ambulatory Visit: Payer: Self-pay | Admitting: Family Medicine

## 2021-10-05 DIAGNOSIS — I10 Essential (primary) hypertension: Secondary | ICD-10-CM

## 2021-10-05 NOTE — Telephone Encounter (Signed)
Ok to refill??  Last office visit 12/30/2020.  Last refill 06/10/2021, #3 refill.

## 2021-10-09 ENCOUNTER — Ambulatory Visit (INDEPENDENT_AMBULATORY_CARE_PROVIDER_SITE_OTHER): Payer: 59

## 2021-10-09 ENCOUNTER — Other Ambulatory Visit: Payer: Self-pay

## 2021-10-09 ENCOUNTER — Encounter (HOSPITAL_COMMUNITY): Payer: Self-pay | Admitting: Emergency Medicine

## 2021-10-09 ENCOUNTER — Ambulatory Visit (HOSPITAL_COMMUNITY)
Admission: EM | Admit: 2021-10-09 | Discharge: 2021-10-09 | Disposition: A | Discharge status: Home / Self Care | Payer: 59 | Attending: Student | Admitting: Student

## 2021-10-09 DIAGNOSIS — S93401A Sprain of unspecified ligament of right ankle, initial encounter: Secondary | ICD-10-CM | POA: Diagnosis not present

## 2021-10-09 DIAGNOSIS — M25571 Pain in right ankle and joints of right foot: Secondary | ICD-10-CM | POA: Diagnosis not present

## 2021-10-09 DIAGNOSIS — S92355A Nondisplaced fracture of fifth metatarsal bone, left foot, initial encounter for closed fracture: Secondary | ICD-10-CM

## 2021-10-09 DIAGNOSIS — W19XXXA Unspecified fall, initial encounter: Secondary | ICD-10-CM | POA: Diagnosis not present

## 2021-10-09 MED ORDER — HYDROCODONE-ACETAMINOPHEN 5-325 MG PO TABS: 2.0000 | ORAL_TABLET | ORAL | 0 refills | Status: DC | PRN

## 2021-10-09 NOTE — ED Provider Notes (Addendum)
[facility]    CSN: [id number] Arrival date & time: 10/09/21  1645      History   Chief Complaint Chief Complaint  Patient presents with   Ankle Pain    bilateral    HPI Elaine Johns is a 45 y.o. female presenting with bilateral ankle pain following fall that occurred 4 days ago.  Medical history falls in the past.  States that she fell down a few steps 4 days ago, inverting the right ankle.  Thinks that she hit the outer left ankle on the way down, because she is also having pain and deformity on the lateral aspect of the foot.  Ambulating with significant pain.  Pain is minimally improved with Advil.  Denies sensation changes.  Denies pain or injury elsewhere including hips, back.  Denies head trauma, loss of consciousness, headaches, dizziness, vision changes.  Denies abdominal pain, hematuria, change in bowel or bladder function.  HPI  Past Medical History:  Diagnosis Date   Abnormal Pap smear and cervical HPV (human papillomavirus)    Anemia    Phreesia 12/29/2020   Anxiety    Phreesia 12/29/2020   History of miscarriage    Hyperlipidemia    Hypertension    Panic disorder     Patient Active Problem List   Diagnosis Date Noted   Vitamin D deficiency 09/24/2013   Panic disorder     Past Surgical History:  Procedure Laterality Date   FRACTURE SURGERY N/A    [employer]reesia 12/29/2020   TUBAL LIGATION      OB History   No obstetric history on file.      Home Medications    Prior to Admission medications   Medication Sig Start Date End Date Taking? Authorizing Provider  HYDROcodone-acetaminophen (NORCO/VICODIN) 5-325 MG tablet Take 2 tablets by mouth every 4 (four) hours as needed. 10/09/21  Yes Rhys Martini, PA-C  Aspirin-Acetaminophen-Caffeine (GOODYS EXTRA STRENGTH) [phone] MG PACK Take 1 packet by mouth 2 (two) times daily as needed (Pain).    [provider]  atorvastatin (LIPITOR) 20 MG tablet Take 1 tablet (20 mg total) by  mouth daily. 06/07/21   Donita Brooks, MD  cephALEXin (KEFLEX) 500 MG capsule Take 1 capsule (500 mg total) by mouth 3 (three) times daily. 08/22/19   Donita Brooks, MD  Cholecalciferol (VITAMIN D) 2000 UNITS CAPS Take 1 capsule by mouth daily.    [provider]  clonazePAM (KLONOPIN) 0.5 MG tablet TAKE 1 TABLET BY MOUTH TWICE A DAY AS NEEDED FOR ANXIETY 10/06/21   Donita Brooks, MD  losartan (COZAAR) 50 MG tablet Take 1 tablet (50 mg total) by mouth daily. 06/09/21   Donita Brooks, MD  PARoxetine (PAXIL) 40 MG tablet TAKE 1 TABLET BY MOUTH EVERY DAY IN THE MORNING 07/28/21   Donita Brooks, MD    Family History History reviewed. No pertinent family history.  Social History Social History   Tobacco Use   Smoking status: Every Day    Packs/day: 1.00    Types: Cigarettes   Smokeless tobacco: Never  Substance Use Topics   Alcohol use: Yes   Drug use: No     Allergies   Antihistamines, chlorpheniramine-type   Review of Systems Review of Systems  Musculoskeletal:        Bilateral ankle pain  All other systems reviewed and are negative.   Physical Exam Triage Vital Signs ED Triage Vitals  Enc Vitals Group     BP  10/09/21 1733 112/78     Pulse Rate 10/09/21 1733 (!) 101     Resp 10/09/21 1733 16     Temp 10/09/21 1733 98.9 F (37.2 C)     Temp Source 10/09/21 1733 Oral     SpO2 10/09/21 1733 99 %     Weight --      Height --      Head Circumference --      Peak Flow --      Pain Score 10/09/21 1731 9     Pain Loc --      Pain Edu? --      Excl. in [place]? --    No data found.  Updated Vital Signs BP 112/78 (BP Location: Left Arm)   Pulse (!) 101   Temp 98.9 F (37.2 C) (Oral)   Resp 16   LMP 10/04/2021   SpO2 99%   Visual Acuity Right Eye Distance:   Left Eye Distance:   Bilateral Distance:    Right Eye Near:   Left Eye Near:    Bilateral Near:     Physical Exam Vitals reviewed.  Constitutional:      General: She is not in  acute distress.    Appearance: Normal appearance. She is well-groomed. She is not ill-appearing or diaphoretic.  HENT:     Head: Normocephalic and atraumatic.     Comments: No abrasion ecchymosis or laceration to head or scalp.    Nose: Nose normal.     Mouth/Throat:     Mouth: No injury or lacerations.     Pharynx: Oropharynx is clear.     Comments: No lip or oral mucosal laceration Mandible is without tenderness or deformity. No trismus or TMJ.  Eyes:     General: Vision grossly intact.     Extraocular Movements: Extraocular movements intact.     Pupils: Pupils are equal, round, and reactive to light.     Comments: No orbital tenderness EOMI, PERRLA  Neck:     Comments: See MSK Cardiovascular:     Rate and Rhythm: Normal rate and regular rhythm.     Heart sounds: Normal heart sounds.  Pulmonary:     Effort: Pulmonary effort is normal.     Breath sounds: Normal breath sounds.  Chest:     Chest wall: No tenderness.  Abdominal:     Palpations: Abdomen is soft.     Tenderness: There is no abdominal tenderness. There is no guarding or rebound.     Comments: No pain   Musculoskeletal:        General: No swelling, tenderness, deformity or signs of injury. Normal range of motion.     Cervical back: Normal. No swelling, edema, deformity, erythema, signs of trauma, lacerations, rigidity, spasms, torticollis, tenderness, bony tenderness or crepitus. No pain with movement. Normal range of motion.     Thoracic back: Normal. No swelling, edema, deformity, signs of trauma, lacerations, spasms, tenderness or bony tenderness. Normal range of motion. No scoliosis.     Lumbar back: Normal. No swelling, edema, deformity, signs of trauma, lacerations, spasms, tenderness or bony tenderness. Normal range of motion. Negative right straight leg raise test and negative left straight leg raise test. No scoliosis.     Right lower leg: No edema.     Left lower leg: No edema.     Comments: R ankle-tender  to palpation lateral malleolus with effusion and ecchymosis.  Range of motion intact but with pain.  No obvious deformity.  No midfoot tenderness.  DP 2+, cap refill less than 2 seconds.  Left ankle with tenderness and deformity over the fifth metatarsal.  Some ecchymosis.  Some lateral malleolar tenderness.  DP 2+, cap refill less than 2 seconds.  Sensation intact.  In wheelchair due to pain.  No cervical, thoracic, lumbar paraspinous tenderness. No midline spinous tenderness deformity or stepoff. Strength and sensation grossly intact upper and lower extremities. No hip or pelvic instability. ROM flexion and extension intact of all other major joints, without laxity tenderness or crepitus.   Skin:    Findings: No signs of injury, laceration or lesion.     Comments: No skin changes  Neurological:     General: No focal deficit present.     Mental Status: She is alert and oriented to person, place, and time.     Cranial Nerves: No cranial nerve deficit.     Sensory: Sensation is intact. No sensory deficit.     Motor: Motor function is intact. No weakness or pronator drift.     Coordination: Coordination is intact. Romberg sign negative. Finger-Nose-Finger Test normal.     Gait: Gait is intact. Gait normal.     Comments: CN 2-12 grossly intact, PERRLA, EOMI. Negative rhomberg, pronator drift, fingers to thumb.   Psychiatric:        Mood and Affect: Mood normal.        Behavior: Behavior normal.        Thought Content: Thought content normal.        Judgment: Judgment normal.     UC Treatments / Results  Labs (all labs ordered are listed, but only abnormal results are displayed) Labs Reviewed - No data to display  EKG   Radiology DG Ankle Complete Left  Result Date: 10/09/2021 CLINICAL DATA:  Lateral malleolar tenderness and 5th metarasal tenderness folloing hitting ankle on cinderblock EXAM: LEFT ANKLE COMPLETE - 3+ VIEW COMPARISON:  None. FINDINGS: There is a nondisplaced  fracture of the base of the fifth metatarsal with likely intra-articular extension. Ankle mortise is preserved. Mild soft tissue edema. No unexpected radiopaque foreign body. IMPRESSION: Nondisplaced fracture of the base of the fifth metatarsal with likely intra-articular extension. Electronically Signed   By: Meda Klinefelter M.D.   On: 10/09/2021 18:03   DG Ankle Complete Right  Result Date: 10/09/2021 CLINICAL DATA:  Lateral malleolar tenderness following inversion EXAM: RIGHT ANKLE - COMPLETE 3+ VIEW COMPARISON:  October 08, 2021 FINDINGS: No acute fracture or dislocation. Joint spaces and alignment are maintained. No area of erosion or osseous destruction. No unexpected radiopaque foreign body. Mild soft tissue edema. IMPRESSION: No acute fracture or dislocation. Electronically Signed   By: Meda Klinefelter M.D.   On: 10/09/2021 18:04    Procedures Procedures (including critical care time)  Medications Ordered in UC Medications - No data to display  Initial Impression / Assessment and Plan / UC Course  I have reviewed the triage vital signs and the nursing notes.  Pertinent labs & imaging results that were available during my care of the patient were reviewed by me and considered in my medical decision making (see chart for details).     This patient is a very pleasant 45 y.o. year old female presenting with right ankle sprain and left fifth metatarsal fracture.  Neurovascularly intact.  Injury sustained during a fall at home 4 days ago.   Xray R ankle- No acute fracture or dislocation.  Xray L foot- Nondisplaced fracture of the base of the  fifth metatarsal with likely intra-articular extension.  For right ankle, patient already has a brace, continue to use this.  For left foot fracture, cam boot, nonweightbearing.  Crutches provided.  Short course of hydrocodone for pain; PDMP reviewed.  Follow-up with Ortho at the earliest convenience, information provided.  *Patient is now  declining crutches, though I did strongly advise that she requires these.  She understands risks of injury to the left foot including poor healing.  Level 4 for acute complicated illness and prescription drug management.  Final Clinical Impressions(s) / UC Diagnoses   Final diagnoses:  Closed nondisplaced fracture of fifth metatarsal bone of left foot, initial encounter  Sprain of right ankle, unspecified ligament, initial encounter  Fall, initial encounter     Discharge Instructions      -You have a right ankle sprain and a left foot fracture -Hydrocodone for pain up to every 4-6 hours -Ibuprofen for additional relief -Do not put weight on the left foot until seen by an orthopedist -Crutches and boot at all times while walking/standing -Right ankle brace until evaluation by ortho -Follow-up with an orthopedist. I recommend [facility] at [address], [place], [place] [address]. You can schedule an appointment by calling [phone]) or online ([email or web address]), but they also have a walk-in clinic M-F 8a-8p and Sat 10a-3p.      ED Prescriptions     Medication Sig Dispense Auth. Provider   HYDROcodone-acetaminophen (NORCO/VICODIN) 5-325 MG tablet Take 2 tablets by mouth every 4 (four) hours as needed. 10 tablet Rhys Martini, PA-C      I have reviewed the PDMP during this encounter.   Rhys Martini, PA-C 10/09/21 1849    Rhys Martini, PA-C 10/09/21 [phone]

## 2021-10-09 NOTE — ED Notes (Signed)
Escorted patient by wheelchair from bathroom.  Patient is in her treatment room

## 2021-10-09 NOTE — Discharge Instructions (Addendum)
-  You have a right ankle sprain and a left foot fracture -Hydrocodone for pain up to every 4-6 hours -Ibuprofen for additional relief -Do not put weight on the left foot until seen by an orthopedist -Crutches and boot at all times while walking/standing -Right ankle brace until evaluation by ortho -Follow-up with an orthopedist. I recommend [facility] at [address], [place], [place] [address]. You can schedule an appointment by calling [phone]) or online ([email or web address]), but they also have a walk-in clinic M-F 8a-8p and Sat 10a-3p.

## 2021-10-09 NOTE — ED Triage Notes (Signed)
Pt presents with bilateral ankle injury xs 4 days. States rolled right ankle.

## 2021-10-14 ENCOUNTER — Ambulatory Visit: Payer: 59 | Admitting: Nurse Practitioner

## 2021-11-03 ENCOUNTER — Other Ambulatory Visit: Payer: Self-pay | Admitting: Family Medicine

## 2022-01-02 ENCOUNTER — Other Ambulatory Visit: Payer: Self-pay | Admitting: Family Medicine

## 2022-01-14 ENCOUNTER — Encounter (HOSPITAL_COMMUNITY): Payer: Self-pay | Admitting: *Deleted

## 2022-01-14 ENCOUNTER — Other Ambulatory Visit: Payer: Self-pay

## 2022-01-14 ENCOUNTER — Ambulatory Visit (HOSPITAL_COMMUNITY)
Admission: EM | Admit: 2022-01-14 | Discharge: 2022-01-14 | Disposition: A | Discharge status: Home / Self Care | Payer: Managed Care, Other (non HMO) | Attending: Physician Assistant | Admitting: Physician Assistant

## 2022-01-14 DIAGNOSIS — J101 Influenza due to other identified influenza virus with other respiratory manifestations: Secondary | ICD-10-CM | POA: Diagnosis not present

## 2022-01-14 DIAGNOSIS — R051 Acute cough: Secondary | ICD-10-CM

## 2022-01-14 LAB — POC INFLUENZA A AND B ANTIGEN (URGENT CARE ONLY)
INFLUENZA A ANTIGEN, POC: POSITIVE — AB
INFLUENZA B ANTIGEN, POC: NEGATIVE

## 2022-01-14 MED ORDER — ALBUTEROL SULFATE HFA 108 (90 BASE) MCG/ACT IN AERS
2.0000 | INHALATION_SPRAY | Freq: Once | RESPIRATORY_TRACT | Status: AC
  Administered 2022-01-14: 2 via RESPIRATORY_TRACT

## 2022-01-14 MED ORDER — OSELTAMIVIR PHOSPHATE 75 MG PO CAPS: 75.0000 mg | ORAL_CAPSULE | Freq: Two times a day (BID) | ORAL | 0 refills | Status: DC

## 2022-01-14 MED ORDER — PROMETHAZINE-DM 6.25-15 MG/5ML PO SYRP: 5.0000 mL | ORAL_SOLUTION | Freq: Two times a day (BID) | ORAL | 0 refills | Status: DC | PRN

## 2022-01-14 MED ORDER — ALBUTEROL SULFATE HFA 108 (90 BASE) MCG/ACT IN AERS
INHALATION_SPRAY | RESPIRATORY_TRACT | Status: AC
  Filled 2022-01-14: qty 6.7

## 2022-01-14 NOTE — ED Provider Notes (Signed)
[facility]    CSN: [id number] Arrival date & time: 01/14/22  1326      History   Chief Complaint Chief Complaint  Patient presents with   Cough   Sore Throat   Facial Pain    HPI Elaine Johns is a 46 y.o. female.   Patient presents today for a 1 day history of URI symptoms.  Reports sore throat, nasal congestion, cough, nausea, vomiting, fever, body aches.  Denies any chest pain, shortness of breath but does report some tightness, diarrhea, abdominal pain, syncope.  She has tried Claritin-D as well as ibuprofen without improvement of symptoms.  Denies any known sick contacts.  She has not had COVID in the past.  She has had COVID-vaccine but has not had booster.  She has not had influenza vaccine.  She denies history of asthma or COPD but is a former smoker with quit date approximately 1 month ago.  She has a history of chronic allergies but these are generally well controlled with as needed antihistamines.  She denies any recent antibiotics.   Past Medical History:  Diagnosis Date   Abnormal Pap smear and cervical HPV (human papillomavirus)    Anemia    [employer]reesia 12/29/2020   Anxiety    [employer]reesia 12/29/2020   History of miscarriage    Hyperlipidemia    Hypertension    Panic disorder     Patient Active Problem List   Diagnosis Date Noted   Vitamin D deficiency 09/24/2013   Panic disorder     Past Surgical History:  Procedure Laterality Date   FRACTURE SURGERY N/A    [employer]reesia 12/29/2020   TUBAL LIGATION      OB History   No obstetric history on file.      Home Medications    Prior to Admission medications   Medication Sig Start Date End Date Taking? Authorizing Provider  oseltamivir (TAMIFLU) 75 MG capsule Take 1 capsule (75 mg total) by mouth every 12 (twelve) hours. 01/14/22  Yes Vonnie Ligman K, PA-C  promethazine-dextromethorphan (PROMETHAZINE-DM) 6.25-15 MG/5ML syrup Take 5 mLs by mouth 2 (two) times daily as needed for cough. 01/14/22  Yes  Canda Podgorski, Derry Skill, PA-C  Aspirin-Acetaminophen-Caffeine (GOODYS EXTRA STRENGTH) [phone] MG PACK Take 1 packet by mouth 2 (two) times daily as needed (Pain).    [provider]  atorvastatin (LIPITOR) 20 MG tablet TAKE 1 TABLET BY MOUTH EVERY DAY 11/03/21   Susy Frizzle, MD  Cholecalciferol (VITAMIN D) 2000 UNITS CAPS Take 1 capsule by mouth daily.    [provider]  clonazePAM (KLONOPIN) 0.5 MG tablet TAKE 1 TABLET BY MOUTH TWICE A DAY AS NEEDED FOR ANXIETY 10/06/21   Susy Frizzle, MD  HYDROcodone-acetaminophen (NORCO/VICODIN) 5-325 MG tablet Take 2 tablets by mouth every 4 (four) hours as needed. 10/09/21   Hazel Sams, PA-C  losartan (COZAAR) 50 MG tablet Take 1 tablet (50 mg total) by mouth daily. 06/09/21   Susy Frizzle, MD  PARoxetine (PAXIL) 40 MG tablet TAKE 1 TABLET BY MOUTH EVERY DAY IN THE MORNING 01/02/22   Susy Frizzle, MD    Family History History reviewed. No pertinent family history.  Social History Social History   Tobacco Use   Smoking status: Every Day    Packs/day: 1.00    Types: Cigarettes   Smokeless tobacco: Never  Substance Use Topics   Alcohol use: Yes   Drug use: No     Allergies   Antihistamines, chlorpheniramine-type  Review of Systems Review of Systems  Constitutional:  Positive for activity change, fatigue and fever. Negative for appetite change.  HENT:  Positive for congestion and sore throat. Negative for sinus pressure and sneezing.   Respiratory:  Positive for cough and chest tightness. Negative for shortness of breath and wheezing.   Cardiovascular:  Negative for chest pain.  Gastrointestinal:  Positive for nausea and vomiting. Negative for abdominal pain and diarrhea.  Musculoskeletal:  Positive for arthralgias and myalgias.  Neurological:  Negative for dizziness, light-headedness and headaches.    Physical Exam Triage Vital Signs ED Triage Vitals  Enc Vitals Group     BP 01/14/22 1452 (!)  152/80     Pulse Rate 01/14/22 1452 (!) 108     Resp 01/14/22 1452 20     Temp 01/14/22 1452 98.8 F (37.1 C)     Temp src --      SpO2 01/14/22 1452 98 %     Weight --      Height --      Head Circumference --      Peak Flow --      Pain Score 01/14/22 1449 0     Pain Loc --      Pain Edu? --      Excl. in [place]? --    No data found.  Updated Vital Signs BP (!) 152/80    Pulse (!) 108    Temp 98.8 F (37.1 C)    Resp 20    LMP 11/17/2021    SpO2 98%   Visual Acuity Right Eye Distance:   Left Eye Distance:   Bilateral Distance:    Right Eye Near:   Left Eye Near:    Bilateral Near:     Physical Exam Vitals reviewed.  Constitutional:      General: She is awake. She is not in acute distress.    Appearance: Normal appearance. She is well-developed. She is not ill-appearing.     Comments: Very pleasant female appears stated age in no acute distress sitting comfortably in exam room  HENT:     Head: Normocephalic and atraumatic.     Right Ear: Tympanic membrane, ear canal and external ear normal. Tympanic membrane is not erythematous or bulging.     Left Ear: Tympanic membrane, ear canal and external ear normal. Tympanic membrane is not erythematous or bulging.     Nose:     Right Sinus: No maxillary sinus tenderness or frontal sinus tenderness.     Left Sinus: No maxillary sinus tenderness or frontal sinus tenderness.     Mouth/Throat:     Pharynx: Uvula midline. Posterior oropharyngeal erythema present. No oropharyngeal exudate.  Cardiovascular:     Rate and Rhythm: Normal rate and regular rhythm.     Heart sounds: Normal heart sounds, S1 normal and S2 normal. No murmur heard. Pulmonary:     Effort: Pulmonary effort is normal.     Breath sounds: Normal breath sounds. No wheezing, rhonchi or rales.     Comments: Clear to auscultation bilaterally Psychiatric:        Behavior: Behavior is cooperative.     UC Treatments / Results  Labs (all labs ordered are listed,  but only abnormal results are displayed) Labs Reviewed  POC INFLUENZA A AND B ANTIGEN (URGENT CARE ONLY) - Abnormal; Notable for the following components:      Result Value   INFLUENZA A ANTIGEN, POC POSITIVE (*)    All other components within  normal limits    EKG   Radiology No results found.  Procedures Procedures (including critical care time)  Medications Ordered in UC Medications  albuterol (VENTOLIN HFA) 108 (90 Base) MCG/ACT inhaler 2 puff (2 puffs Inhalation Given 01/14/22 1541)    Initial Impression / Assessment and Plan / UC Course  I have reviewed the triage vital signs and the nursing notes.  Pertinent labs & imaging results that were available during my care of the patient were reviewed by me and considered in my medical decision making (see chart for details).     Patient is a positive for influenza A.  She is within 48 hours of symptom onset so we will start Tamiflu twice daily.  She was given albuterol in clinic with significant improvement of chest tightness and cough.  She was sent home with this inhaler with instruction to use this every 4-6 hours as needed for shortness of breath and coughing fits.  She was given Promethazine DM for cough but discussed this can be sedating and she should not drive or drink alcohol taking it.  She can use Mucinex, Flonase, Tylenol for symptom relief.  Recommended she rest and drink plenty of fluid.  She was provided work excuse note.  Discussed that if she has any worsening symptoms including high fever not responding to medication, nausea/vomiting interfere with oral intake, chest pain, shortness of breath, weakness she needs to go to the emergency room.  Strict return precautions given to which she expressed understanding.  Final Clinical Impressions(s) / UC Diagnoses   Final diagnoses:  Influenza A  Acute cough     Discharge Instructions      You tested positive for flu.  Please start Tamiflu twice daily.  Use albuterol  inhaler every 4-6 hours as needed for shortness of breath and chest tightness.  Use Promethazine DM for cough.  This will make you sleepy so do not drive or drink alcohol with taking it.  Use Mucinex, Flonase, Tylenol for additional symptom relief.  Make sure you rest and drink plenty of fluid.  If you have any worsening symptoms including high fever, chest pain, shortness of breath, weakness, nausea/vomiting interfering with oral intake you need to go to the emergency room.  If symptoms do not improve within a week please return for reevaluation.     ED Prescriptions     Medication Sig Dispense Auth. Provider   promethazine-dextromethorphan (PROMETHAZINE-DM) 6.25-15 MG/5ML syrup Take 5 mLs by mouth 2 (two) times daily as needed for cough. 118 mL Trudy Kory K, PA-C   oseltamivir (TAMIFLU) 75 MG capsule Take 1 capsule (75 mg total) by mouth every 12 (twelve) hours. 10 capsule Ellanie Oppedisano, Derry Skill, PA-C      PDMP not reviewed this encounter.   Terrilee Croak, PA-C 01/14/22 1617

## 2022-01-14 NOTE — ED Triage Notes (Signed)
Pt reports sore throat and cough started las nigh t. Pt had a fever 100.5 this AM.

## 2022-01-14 NOTE — Discharge Instructions (Signed)
You tested positive for flu.  Please start Tamiflu twice daily.  Use albuterol inhaler every 4-6 hours as needed for shortness of breath and chest tightness.  Use Promethazine DM for cough.  This will make you sleepy so do not drive or drink alcohol with taking it.  Use Mucinex, Flonase, Tylenol for additional symptom relief.  Make sure you rest and drink plenty of fluid.  If you have any worsening symptoms including high fever, chest pain, shortness of breath, weakness, nausea/vomiting interfering with oral intake you need to go to the emergency room.  If symptoms do not improve within a week please return for reevaluation.

## 2022-01-18 ENCOUNTER — Other Ambulatory Visit: Payer: Self-pay

## 2022-01-18 ENCOUNTER — Telehealth (INDEPENDENT_AMBULATORY_CARE_PROVIDER_SITE_OTHER): Payer: Self-pay | Admitting: Nurse Practitioner

## 2022-01-18 DIAGNOSIS — Z91199 Patient's noncompliance with other medical treatment and regimen due to unspecified reason: Secondary | ICD-10-CM

## 2022-01-18 NOTE — Progress Notes (Signed)
Link sent to patient's preferred phone number via [facility] at 10:15 am.  No call back to office or response to link at 10:26 am.  Visit cancelled.

## 2022-01-19 ENCOUNTER — Telehealth (INDEPENDENT_AMBULATORY_CARE_PROVIDER_SITE_OTHER): Payer: Self-pay | Admitting: Nurse Practitioner

## 2022-01-19 DIAGNOSIS — J329 Chronic sinusitis, unspecified: Secondary | ICD-10-CM

## 2022-01-19 DIAGNOSIS — B9689 Other specified bacterial agents as the cause of diseases classified elsewhere: Secondary | ICD-10-CM

## 2022-01-19 MED ORDER — AMOXICILLIN-POT CLAVULANATE 875-125 MG PO TABS: 1.0000 | ORAL_TABLET | Freq: Two times a day (BID) | ORAL | 0 refills | Status: AC

## 2022-01-19 NOTE — Progress Notes (Signed)
Subjective:    Patient ID: Elaine Johns, female    DOB: [birth year], 46 y.o.   MRN: [id number]  HPI: Elaine Johns is a 46 y.o. female presenting virtually for sinus infection.  Chief Complaint  Patient presents with   Sinusitis   UPPER RESPIRATORY TRACT INFECTION Onset: has been having sinus symptoms for weeks; symptoms worsened last week and she tested positive for the flu.  Reports her flu symptoms are better, sinuses are not.    Fever: no Body aches: no Chills: no Cough:  yes; nonproductive Shortness of breath: no Wheezing: no Chest pain: no Chest tightness: no Chest congestion: no Nasal congestion: yes; thick and "gross" Runny nose: yes Post nasal drip: yes Sneezing: no Sore throat: yes Swollen glands: no Sinus pressure: yes; under eyes and across forehead Headache: yes Face pain: no Toothache: yes Ear pain: no  Ear pressure: no  Eyes red/itching:no Eye drainage/crusting: no  Nausea: no  Vomiting: no Diarrhea: no  Change in appetite: no  Loss of taste/smell: yes  Rash: no Fatigue: yes Sick contacts: no Strep contacts: no  Context: stable Recurrent sinusitis: no Treatments attempted: albuterol inhaler - useful in the morning;  Relief with OTC medications: n/a  Allergies  Allergen Reactions   Antihistamines, Chlorpheniramine-Type     Raised heart rate.    Outpatient Encounter Medications as of 01/19/2022  Medication Sig   amoxicillin-clavulanate (AUGMENTIN) 875-125 MG tablet Take 1 tablet by mouth 2 (two) times daily for 7 days.   Aspirin-Acetaminophen-Caffeine (GOODYS EXTRA STRENGTH) 500-325-65 MG PACK Take 1 packet by mouth 2 (two) times daily as needed (Pain).   atorvastatin (LIPITOR) 20 MG tablet TAKE 1 TABLET BY MOUTH EVERY DAY   Cholecalciferol (VITAMIN D) 2000 UNITS CAPS Take 1 capsule by mouth daily.   clonazePAM (KLONOPIN) 0.5 MG tablet TAKE 1 TABLET BY MOUTH TWICE A DAY AS NEEDED FOR ANXIETY   HYDROcodone-acetaminophen  (NORCO/VICODIN) 5-325 MG tablet Take 2 tablets by mouth every 4 (four) hours as needed.   losartan (COZAAR) 50 MG tablet Take 1 tablet (50 mg total) by mouth daily.   oseltamivir (TAMIFLU) 75 MG capsule Take 1 capsule (75 mg total) by mouth every 12 (twelve) hours.   PARoxetine (PAXIL) 40 MG tablet TAKE 1 TABLET BY MOUTH EVERY DAY IN THE MORNING   promethazine-dextromethorphan (PROMETHAZINE-DM) 6.25-15 MG/5ML syrup Take 5 mLs by mouth 2 (two) times daily as needed for cough.   No facility-administered encounter medications on file as of 01/19/2022.    Patient Active Problem List   Diagnosis Date Noted   Vitamin D deficiency 09/24/2013   Panic disorder     Past Medical History:  Diagnosis Date   Abnormal Pap smear and cervical HPV (human papillomavirus)    Anemia    Phreesia 12/29/2020   Anxiety    Phreesia 12/29/2020   History of miscarriage    Hyperlipidemia    Hypertension    Panic disorder     Relevant past medical, surgical, family and social history reviewed and updated as indicated. Interim medical history since our last visit reviewed.  Review of Systems Per HPI unless specifically indicated above     Objective:    There were no vitals taken for this visit.  Wt Readings from Last 3 Encounters:  06/29/20 102 lb (46.3 kg)  12/26/18 99 lb (44.9 kg)  07/24/18 103 lb (46.7 kg)    Physical Exam Vitals and nursing note reviewed.  Constitutional:      General: She  is not in acute distress.    Appearance: Normal appearance. She is not ill-appearing or toxic-appearing.  HENT:     Head: Normocephalic and atraumatic.     Nose: Congestion present. No rhinorrhea.     Mouth/Throat:     Mouth: Mucous membranes are moist.     Pharynx: Oropharynx is clear.  Eyes:     General: No scleral icterus.       Right eye: No discharge.        Left eye: No discharge.     Extraocular Movements: Extraocular movements intact.  Cardiovascular:     Comments: Unable to assess via virtual  visit. Pulmonary:     Effort: Pulmonary effort is normal. No respiratory distress.     Comments: Unable to assess via virtual visit.  Patient talking in complete sentences during telemedicine visit without accessory muscle use. Skin:    Coloration: Skin is not jaundiced or pale.     Findings: No erythema.  Neurological:     Mental Status: She is alert and oriented to person, place, and time.      Assessment & Plan:  1. Bacterial sinusitis Acute.  Symptoms and exam consistent with bacterial sinus infection.  Start Augmentin twice daily for 7 days.  Encouraged plenty of hydration, saline rinses, Mucinex to help thin secretions.  Follow up if symptoms worsen or persist after antibiotics.   - amoxicillin-clavulanate (AUGMENTIN) 875-125 MG tablet; Take 1 tablet by mouth 2 (two) times daily for 7 days.  Dispense: 14 tablet; Refill: 0    Follow up plan: Return if symptoms worsen or fail to improve.  Due to the catastrophic nature of the COVID-19 pandemic, this video visit was completed soley via audio and visual contact via Caregility due to the restrictions of the COVID-19 pandemic.  All issues as above were discussed and addressed. Physical exam was done as above through visual confirmation on Caregility. If it was felt that the patient should be evaluated in the office, they were directed there. The patient verbally consented to this visit. Location of the patient: home Location of the provider: work Those involved with this call:  Provider: Noemi Chapel, DNP, FNP-C CMA: Elaine Johns, CMA Front Desk/Registration: Elaine Johns  Time spent on call:  6 minutes with patient face to face via video conference. More than 50% of this time was spent in counseling and coordination of care. 13 minutes total spent in review of patient's record and preparation of their chart. I verified patient identity using two factors (patient name and date of birth). Patient consents verbally to being seen via  telemedicine visit today.

## 2022-01-23 ENCOUNTER — Other Ambulatory Visit: Payer: Self-pay | Admitting: Family Medicine

## 2022-01-31 ENCOUNTER — Other Ambulatory Visit: Payer: Self-pay | Admitting: Family Medicine

## 2022-01-31 DIAGNOSIS — I10 Essential (primary) hypertension: Secondary | ICD-10-CM

## 2022-02-01 NOTE — Telephone Encounter (Signed)
LOV IN PERSON 06/29/20 Video visit/sick visit 01/19/22 w/JM  Last refill 10/06/21, #60, 3 refills  Please review, thanks!

## 2022-02-20 ENCOUNTER — Other Ambulatory Visit: Payer: Self-pay | Admitting: Family Medicine

## 2022-02-28 ENCOUNTER — Ambulatory Visit: Payer: Self-pay | Admitting: Family Medicine

## 2022-03-21 ENCOUNTER — Ambulatory Visit (INDEPENDENT_AMBULATORY_CARE_PROVIDER_SITE_OTHER): Payer: Managed Care, Other (non HMO) | Admitting: Family Medicine

## 2022-03-21 VITALS — BP 151/110 | HR 108 | Temp 97.2°F | Ht 59.0 in | Wt 121.0 lb

## 2022-03-21 DIAGNOSIS — E7800 Pure hypercholesterolemia, unspecified: Secondary | ICD-10-CM

## 2022-03-21 DIAGNOSIS — I10 Essential (primary) hypertension: Secondary | ICD-10-CM | POA: Diagnosis not present

## 2022-03-21 DIAGNOSIS — F411 Generalized anxiety disorder: Secondary | ICD-10-CM

## 2022-03-21 MED ORDER — VORTIOXETINE HBR 10 MG PO TABS: 10.0000 mg | ORAL_TABLET | Freq: Every day | ORAL | 3 refills | Status: DC

## 2022-03-21 MED ORDER — CLONAZEPAM 0.5 MG PO TABS: 0.5000 mg | ORAL_TABLET | Freq: Three times a day (TID) | ORAL | 0 refills | Status: DC | PRN

## 2022-03-21 NOTE — Progress Notes (Signed)
? ?Subjective:  ? ? Patient ID: Elaine Johns, female    DOB: [birth year], 46 y.o.   MRN: [id number] ? ?Patient has been on Paxil for years.  She been taking Klonopin twice a day for years.  Recently she had increased to 3 times a day.  Her anxiety is out of control.  She reports feeling shaky and nervous all the time.  She had to quit to job because of panic attacks.  She will start feeling like she cannot breathe and she has to rush out of the building.  She is unable to go back to work the present time because she cannot follow-up with panic attacks.  She denies any depression or suicidal ideation however the anxiety has become overwhelming and debilitating.  She denies any mania or flight of ideas or tangential speech.  She denies any delusions or hallucinations.  Blood pressure at home is in the 120-130 range systolic over 70-80 range diastolic however she is extremely anxious.  He is visible this is anxious today as she is literally shaking on exam and on the verge of tears. ?Past Medical History:  ?Diagnosis Date  ? Abnormal Pap smear and cervical HPV (human papillomavirus)   ? Anemia   ? [employer]reesia 12/29/2020  ? Anxiety   ? [employer]reesia 12/29/2020  ? History of miscarriage   ? Hyperlipidemia   ? Hypertension   ? Panic disorder   ? ?Past Surgical History:  ?Procedure Laterality Date  ? FRACTURE SURGERY N/A   ? [employer]reesia 12/29/2020  ? TUBAL LIGATION    ? ?Current Outpatient Medications on File Prior to Visit  ?Medication Sig Dispense Refill  ? Aspirin-Acetaminophen-Caffeine (GOODYS EXTRA STRENGTH) 500-325-65 MG PACK Take 1 packet by mouth 2 (two) times daily as needed (Pain).    ? atorvastatin (LIPITOR) 20 MG tablet TAKE 1 TABLET BY MOUTH EVERY DAY 90 tablet 1  ? Cholecalciferol (VITAMIN D) 2000 UNITS CAPS Take 1 capsule by mouth daily.    ? clonazePAM (KLONOPIN) 0.5 MG tablet TAKE 1 TABLET BY MOUTH TWICE A DAY AS NEEDED FOR ANXIETY 60 tablet 3  ? losartan (COZAAR) 50 MG tablet Take 1 tablet (50 mg total) by mouth  daily. 90 tablet 3  ? PARoxetine (PAXIL) 40 MG tablet TAKE 1 TABLET BY MOUTH EVERY DAY IN THE MORNING 30 tablet 0  ? ?No current facility-administered medications on file prior to visit.  ? ?Allergies  ?Allergen Reactions  ? Antihistamines, Chlorpheniramine-Type   ?  Raised heart rate.  ? ?Social History  ? ?Socioeconomic History  ? Marital status: Married  ?  Spouse name: Not on file  ? Number of children: Not on file  ? Years of education: Not on file  ? Highest education level: Not on file  ?Occupational History  ? Not on file  ?Tobacco Use  ? Smoking status: Every Day  ?  Packs/day: 1.00  ?  Types: Cigarettes  ? Smokeless tobacco: Never  ?Substance and Sexual Activity  ? Alcohol use: Yes  ? Drug use: No  ? Sexual activity: Not on file  ?Other Topics Concern  ? Not on file  ?Social History Narrative  ? Not on file  ? ?Social Determinants of Health  ? ?Financial Resource Strain: Not on file  ?Food Insecurity: Not on file  ?Transportation Needs: Not on file  ?Physical Activity: Not on file  ?Stress: Not on file  ?Social Connections: Not on file  ?Intimate Partner Violence: Not on file  ? ? ? ? ?  Review of Systems  ?All other systems reviewed and are negative. ? ?   ?Objective:  ? Physical Exam ?Vitals reviewed.  ?Constitutional:   ?   General: She is not in acute distress. ?   Appearance: She is well-developed. She is not diaphoretic.  ?HENT:  ?   Head: Normocephalic and atraumatic.  ?   Right Ear: External ear normal.  ?   Left Ear: External ear normal.  ?   Nose: Nose normal.  ?   Mouth/Throat:  ?   Pharynx: No oropharyngeal exudate.  ?Eyes:  ?   General: No scleral icterus.    ?   Right eye: No discharge.     ?   Left eye: No discharge.  ?   Conjunctiva/sclera: Conjunctivae normal.  ?   Pupils: Pupils are equal, round, and reactive to light.  ?Neck:  ?   Thyroid: No thyromegaly.  ?   Vascular: No JVD.  ?   Trachea: No tracheal deviation.  ?Cardiovascular:  ?   Rate and Rhythm: Normal rate and regular rhythm.  ?    Heart sounds: Normal heart sounds. No murmur heard. ?  No friction rub. No gallop.  ?Pulmonary:  ?   Effort: Pulmonary effort is normal. No respiratory distress.  ?   Breath sounds: Normal breath sounds. No stridor. No wheezing or rales.  ?Chest:  ?   Chest wall: No tenderness.  ?Abdominal:  ?   General: Bowel sounds are normal. There is no distension.  ?   Palpations: Abdomen is soft. There is no mass.  ?   Tenderness: There is no abdominal tenderness. There is no guarding or rebound.  ?   Hernia: No hernia is present.  ?Musculoskeletal:     ?   General: No tenderness or deformity. Normal range of motion.  ?   Cervical back: Normal range of motion and neck supple.  ?Lymphadenopathy:  ?   Cervical: No cervical adenopathy.  ?Skin: ?   General: Skin is warm.  ?   Coloration: Skin is not pale.  ?   Findings: No erythema or rash.  ?Neurological:  ?   Mental Status: She is alert and oriented to person, place, and time.  ?   Cranial Nerves: No cranial nerve deficit.  ?   Sensory: No sensory deficit.  ?   Motor: No abnormal muscle tone.  ?   Coordination: Coordination normal.  ?   Deep Tendon Reflexes: Reflexes normal.  ?Psychiatric:     ?   Attention and Perception: Attention and perception normal.     ?   Mood and Affect: Mood is anxious. Affect is tearful.     ?   Speech: She is communicative. Speech is not rapid and pressured, slurred or tangential.     ?   Behavior: Behavior normal.     ?   Thought Content: Thought content normal.     ?   Cognition and Memory: Cognition and memory normal.     ?   Judgment: Judgment normal.  ? ? ? ? ? ?   ?Assessment & Plan:  ?Benign essential HTN - Plan: CBC with Differential/Platelet, Lipid panel, COMPLETE METABOLIC PANEL WITH GFR ? ?GAD (generalized anxiety disorder) ? ?Pure hypercholesterolemia - Plan: CBC with Differential/Platelet, Lipid panel, COMPLETE METABOLIC PANEL WITH GFR ?Blood pressure today is out of control that I believe is due to her anxiety.  Her home blood pressures  are much better.  Wean off Paxil over the  next 2 weeks and start Trintellix 5 mg daily.  Increase to 10 mg daily in 1 week.  Recheck the patient in 4 to 6 weeks or sooner if worse.  Temporarily increase Klonopin to 1 tablet 3 times a day. ?Currently her panic disorder is well controlled on the combination of Paxil and Klonopin.  I will make no changes in this.  Her blood pressure today is well controlled.  Given the recent heavy bleeding I recommended that she discontinue amlodipine until the situation stabilizes.  We will continue losartan.  I will check a CMP and a fasting lipid panel.  I encourage smoking cessation.  I will also check a CBC.  If patient's hemoglobin is not improving she will need to start an iron tablet.  We will also need to schedule the patient to see gynecology to determine a more viable long-term option such as ablation if her hemoglobin remains low. ?

## 2022-03-22 ENCOUNTER — Other Ambulatory Visit: Payer: Self-pay | Admitting: Family Medicine

## 2022-03-22 ENCOUNTER — Telehealth: Payer: Self-pay | Admitting: Family Medicine

## 2022-03-22 LAB — CBC WITH DIFFERENTIAL/PLATELET
Absolute Monocytes: 592 cells/uL (ref 200–950)
Basophils Absolute: 52 cells/uL (ref 0–200)
Basophils Relative: 0.8 %
Eosinophils Absolute: 20 cells/uL (ref 15–500)
Eosinophils Relative: 0.3 %
HCT: 30.4 % — ABNORMAL LOW (ref 35.0–45.0)
Hemoglobin: 9.8 g/dL — ABNORMAL LOW (ref 11.7–15.5)
Lymphs Abs: 709 cells/uL — ABNORMAL LOW (ref 850–3900)
MCH: 26.9 pg — ABNORMAL LOW (ref 27.0–33.0)
MCHC: 32.2 g/dL (ref 32.0–36.0)
MCV: 83.5 fL (ref 80.0–100.0)
MPV: 11.5 fL (ref 7.5–12.5)
Monocytes Relative: 9.1 %
Neutro Abs: 5129 cells/uL (ref 1500–7800)
Neutrophils Relative %: 78.9 %
Platelets: 220 10*3/uL (ref 140–400)
RBC: 3.64 10*6/uL — ABNORMAL LOW (ref 3.80–5.10)
RDW: 14.3 % (ref 11.0–15.0)
Total Lymphocyte: 10.9 %
WBC: 6.5 10*3/uL (ref 3.8–10.8)

## 2022-03-22 LAB — COMPLETE METABOLIC PANEL WITH GFR
AG Ratio: 1.6 (calc) (ref 1.0–2.5)
ALT: 40 U/L — ABNORMAL HIGH (ref 6–29)
AST: 61 U/L — ABNORMAL HIGH (ref 10–35)
Albumin: 4.1 g/dL (ref 3.6–5.1)
Alkaline phosphatase (APISO): 66 U/L (ref 31–125)
BUN: 9 mg/dL (ref 7–25)
CO2: 23 mmol/L (ref 20–32)
Calcium: 9 mg/dL (ref 8.6–10.2)
Chloride: 105 mmol/L (ref 98–110)
Creat: 0.74 mg/dL (ref 0.50–0.99)
Globulin: 2.6 g/dL (calc) (ref 1.9–3.7)
Glucose, Bld: 80 mg/dL (ref 65–99)
Potassium: 4.6 mmol/L (ref 3.5–5.3)
Sodium: 139 mmol/L (ref 135–146)
Total Bilirubin: 0.3 mg/dL (ref 0.2–1.2)
Total Protein: 6.7 g/dL (ref 6.1–8.1)
eGFR: 101 mL/min/{1.73_m2} (ref >=60)

## 2022-03-22 LAB — LIPID PANEL
Cholesterol: 227 mg/dL — ABNORMAL HIGH (ref <200)
HDL: 101 mg/dL (ref >=50)
LDL Cholesterol (Calc): 111 mg/dL (calc) — ABNORMAL HIGH
Non-HDL Cholesterol (Calc): 126 mg/dL (calc) (ref <130)
Total CHOL/HDL Ratio: 2.2 (calc) (ref <5.0)
Triglycerides: 68 mg/dL (ref <150)

## 2022-03-22 NOTE — Telephone Encounter (Signed)
Patient called to request lab results from tests taken yesterday. Please advise at [phone]. ?

## 2022-03-23 NOTE — Telephone Encounter (Signed)
Patient calling back requesting result notes. Please advise [phone]. ?

## 2022-03-27 NOTE — Telephone Encounter (Signed)
Spoke with pt today about her results. Pt voiced understanding. ?

## 2022-03-28 ENCOUNTER — Other Ambulatory Visit: Payer: Self-pay

## 2022-03-28 DIAGNOSIS — D649 Anemia, unspecified: Secondary | ICD-10-CM

## 2022-04-03 ENCOUNTER — Telehealth: Payer: Self-pay

## 2022-04-03 NOTE — Telephone Encounter (Signed)
Pt called stated that Trintellix is not helping her, she actually feels worst. She feels nausea, goes to bathroom quite often, plus her insurance will not cover it so its more expensive for her.  ? ?She still has her Paxil. Do want her to go back on her paxil or try something else? ? ?Pls advise ? ?

## 2022-04-06 ENCOUNTER — Telehealth: Payer: Self-pay

## 2022-04-06 ENCOUNTER — Other Ambulatory Visit: Payer: Self-pay

## 2022-04-06 MED ORDER — VENLAFAXINE HCL ER 75 MG PO CP24: 75.0000 mg | ORAL_CAPSULE | Freq: Every day | ORAL | 1 refills | Status: DC

## 2022-04-06 NOTE — Telephone Encounter (Signed)
Spoke with pt today, c/o that she is not feeling well at all, still having really bad diarrhea. Per pt she is having suicidal thoughts.  ? ?Advice pt to go to urgent care if she continues to feel this way. Also provide pt with [facility] [phone]. Also advice pt to be around with another person.  ? ?Pt is aware that her Rx is being sent today as well.  ?

## 2022-04-11 NOTE — Telephone Encounter (Signed)
Spoke with pt today. Per pt since starting the effexor, she has been doing better. ?

## 2022-04-17 ENCOUNTER — Ambulatory Visit: Payer: Self-pay | Admitting: Family Medicine

## 2022-04-19 ENCOUNTER — Telehealth: Payer: Self-pay

## 2022-04-19 NOTE — Telephone Encounter (Signed)
Per pt she thinks that has has a sinus infection. Would to know if you can  call in a script for sinus infection.  ? ?Please advice ?

## 2022-04-20 ENCOUNTER — Other Ambulatory Visit: Payer: Self-pay

## 2022-04-20 MED ORDER — LEVOCETIRIZINE DIHYDROCHLORIDE 5 MG PO TABS: 5.0000 mg | ORAL_TABLET | Freq: Every evening | ORAL | 0 refills | Status: DC

## 2022-04-21 ENCOUNTER — Other Ambulatory Visit: Payer: Self-pay | Admitting: Family Medicine

## 2022-04-21 DIAGNOSIS — I10 Essential (primary) hypertension: Secondary | ICD-10-CM

## 2022-04-21 MED ORDER — LEVOCETIRIZINE DIHYDROCHLORIDE 5 MG PO TABS: 5.0000 mg | ORAL_TABLET | Freq: Every evening | ORAL | 0 refills | Status: DC

## 2022-04-21 NOTE — Telephone Encounter (Signed)
LOV 03/21/22 ?Last refill 03/21/22, #90, 0 refills ? ?Please review, thanks! ? ?

## 2022-04-21 NOTE — Telephone Encounter (Signed)
Requested medication (s) are due for refill today: yes ? ?Requested medication (s) are on the active medication list: yes   ? ?Last refill: 03/21/22  #90  0 refills ? ?Future visit scheduled No ? ?Notes to clinic: Not delegated, please review. Thank you. ? ?Requested Prescriptions  ?Pending Prescriptions Disp Refills  ? clonazePAM (KLONOPIN) 0.5 MG tablet [Pharmacy Med Name: CLONAZEPAM 0.5 MG TABLET] 90 tablet 0  ?  Sig: TAKE 1 TABLET BY MOUTH 3 TIMES DAILY AS NEEDED FOR ANXIETY.  ?  ? Not Delegated - Psychiatry: Anxiolytics/Hypnotics 2 Failed - 04/21/2022 10:52 AM  ?  ?  Failed - This refill cannot be delegated  ?  ?  Failed - Urine Drug Screen completed in last 360 days  ?  ?  Passed - Patient is not pregnant  ?  ?  Passed - Valid encounter within last 6 months  ?  Recent Outpatient Visits   ? ?      ? 1 month ago Benign essential HTN  ? [facility] Pickard, Priscille Heidelberg, MD  ? 3 months ago Bacterial sinusitis  ? [facility] Valentino Nose, NP  ? 3 months ago No-show for appointment  ? [facility] Cathlean Marseilles A, NP  ? 1 year ago Viral upper respiratory tract infection  ? [facility] Pickard, Priscille Heidelberg, MD  ? 1 year ago Benign essential HTN  ? [facility] Pickard, Priscille Heidelberg, MD  ? ?  ?  ? ? ?  ?  ?  ?Signed Prescriptions Disp Refills  ? losartan (COZAAR) 50 MG tablet 90 tablet 0  ?  Sig: TAKE 1 TABLET BY MOUTH EVERY DAY  ?  ? Cardiovascular:  Angiotensin Receptor Blockers Failed - 04/21/2022 10:52 AM  ?  ?  Failed - Last BP in normal range  ?  BP Readings from Last 1 Encounters:  ?03/21/22 (!) 151/110  ?  ?  ?  ?  Passed - Cr in normal range and within 180 days  ?  Creat  ?Date Value Ref Range Status  ?03/21/2022 0.74 0.50 - 0.99 mg/dL Final  ?  ?  ?  ?  Passed - K in normal range and within 180 days  ?  Potassium  ?Date Value Ref Range Status  ?03/21/2022 4.6 3.5 - 5.3 mmol/L Final  ?  ?  ?  ?  Passed - Patient is not pregnant  ?   ?  Passed - Valid encounter within last 6 months  ?  Recent Outpatient Visits   ? ?      ? 1 month ago Benign essential HTN  ? [facility] Pickard, Priscille Heidelberg, MD  ? 3 months ago Bacterial sinusitis  ? [facility] Valentino Nose, NP  ? 3 months ago No-show for appointment  ? [facility] Cathlean Marseilles A, NP  ? 1 year ago Viral upper respiratory tract infection  ? [facility] Pickard, Priscille Heidelberg, MD  ? 1 year ago Benign essential HTN  ? [facility] Pickard, Priscille Heidelberg, MD  ? ?  ?  ? ? ?  ?  ?  ? ? ? ? ?

## 2022-04-21 NOTE — Telephone Encounter (Signed)
Requested Prescriptions  ?Pending Prescriptions Disp Refills  ?? clonazePAM (KLONOPIN) 0.5 MG tablet [Pharmacy Med Name: CLONAZEPAM 0.5 MG TABLET] 90 tablet 0  ?  Sig: TAKE 1 TABLET BY MOUTH 3 TIMES DAILY AS NEEDED FOR ANXIETY.  ?  ? Not Delegated - Psychiatry: Anxiolytics/Hypnotics 2 Failed - 04/21/2022 10:52 AM  ?  ?  Failed - This refill cannot be delegated  ?  ?  Failed - Urine Drug Screen completed in last 360 days  ?  ?  Passed - Patient is not pregnant  ?  ?  Passed - Valid encounter within last 6 months  ?  Recent Outpatient Visits   ?      ? 1 month ago Benign essential HTN  ? [facility] Pickard, Priscille Heidelberg, MD  ? 3 months ago Bacterial sinusitis  ? [facility] Valentino Nose, NP  ? 3 months ago No-show for appointment  ? [facility] Cathlean Marseilles A, NP  ? 1 year ago Viral upper respiratory tract infection  ? [facility] Pickard, Priscille Heidelberg, MD  ? 1 year ago Benign essential HTN  ? [facility] Pickard, Priscille Heidelberg, MD  ?  ?  ? ?  ?  ?  ?? losartan (COZAAR) 50 MG tablet [Pharmacy Med Name: LOSARTAN POTASSIUM 50 MG TAB] 90 tablet 0  ?  Sig: TAKE 1 TABLET BY MOUTH EVERY DAY  ?  ? Cardiovascular:  Angiotensin Receptor Blockers Failed - 04/21/2022 10:52 AM  ?  ?  Failed - Last BP in normal range  ?  BP Readings from Last 1 Encounters:  ?03/21/22 (!) 151/110  ?   ?  ?  Passed - Cr in normal range and within 180 days  ?  Creat  ?Date Value Ref Range Status  ?03/21/2022 0.74 0.50 - 0.99 mg/dL Final  ?   ?  ?  Passed - K in normal range and within 180 days  ?  Potassium  ?Date Value Ref Range Status  ?03/21/2022 4.6 3.5 - 5.3 mmol/L Final  ?   ?  ?  Passed - Patient is not pregnant  ?  ?  Passed - Valid encounter within last 6 months  ?  Recent Outpatient Visits   ?      ? 1 month ago Benign essential HTN  ? [facility] Pickard, Priscille Heidelberg, MD  ? 3 months ago Bacterial sinusitis  ? [facility]  Valentino Nose, NP  ? 3 months ago No-show for appointment  ? [facility] Cathlean Marseilles A, NP  ? 1 year ago Viral upper respiratory tract infection  ? [facility] Pickard, Priscille Heidelberg, MD  ? 1 year ago Benign essential HTN  ? [facility] Pickard, Priscille Heidelberg, MD  ?  ?  ? ?  ?  ?  ? ? ?

## 2022-04-21 NOTE — Telephone Encounter (Signed)
I spoke with pt 04/21/22, per pt she stated that she can't afford the medicine otc, after speaking with Sheena. Sheena Stated that I can call it into the pharmacy. I did advice pt that she might have to pay out of pocket if her insurance don't cover it.  ? ?Pt voice understanding.   ?

## 2022-04-28 ENCOUNTER — Other Ambulatory Visit: Payer: Self-pay | Admitting: Family Medicine

## 2022-05-01 NOTE — Telephone Encounter (Signed)
Requested Prescriptions  ?Pending Prescriptions Disp Refills  ?? venlafaxine XR (EFFEXOR-XR) 75 MG 24 hr capsule [Pharmacy Med Name: VENLAFAXINE HCL ER 75 MG CAP] 90 capsule 1  ?  Sig: TAKE 1 CAPSULE BY MOUTH DAILY WITH BREAKFAST.  ?  ? Psychiatry: Antidepressants - SNRI - desvenlafaxine & venlafaxine Failed - 04/28/2022  1:30 PM  ?  ?  Failed - Last BP in normal range  ?  BP Readings from Last 1 Encounters:  ?03/21/22 (!) 151/110  ?   ?  ?  Failed - Lipid Panel in normal range within the last 12 months  ?  Cholesterol  ?Date Value Ref Range Status  ?03/21/2022 227 (H) <200 mg/dL Final  ? ?LDL Cholesterol (Calc)  ?Date Value Ref Range Status  ?03/21/2022 111 (H) mg/dL (calc) Final  ?  Comment:  ?  Reference range: <100 ?Marland Kitchen ?Desirable range <100 mg/dL for primary prevention;   ?<70 mg/dL for patients with CHD or diabetic patients  ?with > or = 2 CHD risk factors. ?. ?LDL-C is now calculated using the Martin-Hopkins  ?calculation, which is a validated novel method providing  ?better accuracy than the Friedewald equation in the  ?estimation of LDL-C.  ?Horald Pollen et al. Lenox Ahr. 2197;588(32): 2061-2068  ?(http://education.[employer].com/faq/FAQ164) ?  ? ?HDL  ?Date Value Ref Range Status  ?03/21/2022 101 > OR = 50 mg/dL Final  ? ?Triglycerides  ?Date Value Ref Range Status  ?03/21/2022 68 <150 mg/dL Final  ? ?  ?  ?  Passed - Cr in normal range and within 360 days  ?  Creat  ?Date Value Ref Range Status  ?03/21/2022 0.74 0.50 - 0.99 mg/dL Final  ?   ?  ?  Passed - Valid encounter within last 6 months  ?  Recent Outpatient Visits   ?      ? 1 month ago Benign essential HTN  ? [facility] Pickard, Priscille Heidelberg, MD  ? 3 months ago Bacterial sinusitis  ? [facility] Valentino Nose, NP  ? 3 months ago No-show for appointment  ? [facility] Cathlean Marseilles A, NP  ? 1 year ago Viral upper respiratory tract infection  ? [facility] Pickard, Priscille Heidelberg, MD  ?  1 year ago Benign essential HTN  ? [facility] Pickard, Priscille Heidelberg, MD  ?  ?  ? ?  ?  ?  ? ? ?

## 2022-05-11 ENCOUNTER — Other Ambulatory Visit: Payer: Self-pay | Admitting: Family Medicine

## 2022-05-11 ENCOUNTER — Ambulatory Visit (INDEPENDENT_AMBULATORY_CARE_PROVIDER_SITE_OTHER): Payer: Commercial Managed Care - HMO | Admitting: Family Medicine

## 2022-05-11 VITALS — BP 142/98 | HR 81 | Temp 97.7°F | Ht 59.0 in | Wt 120.0 lb

## 2022-05-11 DIAGNOSIS — I10 Essential (primary) hypertension: Secondary | ICD-10-CM

## 2022-05-11 DIAGNOSIS — F411 Generalized anxiety disorder: Secondary | ICD-10-CM

## 2022-05-11 DIAGNOSIS — Z111 Encounter for screening for respiratory tuberculosis: Secondary | ICD-10-CM

## 2022-05-11 MED ORDER — VENLAFAXINE HCL ER 150 MG PO TB24: 150.0000 mg | ORAL_TABLET | Freq: Every day | ORAL | 3 refills | Status: DC

## 2022-05-11 NOTE — Progress Notes (Signed)
Subjective:    Patient ID: Elaine Johns, female    DOB: 04/22/1976, 46 y.o.   MRN: [id number] I am very happy to say that the patient feels like she is doing much better.  She states that the venlafaxine seems to be helping her.  She is not as anxious.  She has been able to wean back on the Klonopin.  She is taking 1 Klonopin in the morning and a half a Klonopin later in the afternoon.  The anxiety has improved dramatically.  She denies any depression or suicidal thoughts.  Her blood pressure is elevated here however she is nervous about driving to apply for this job.  She states that she has been checking her blood pressure at home and has been running 130s over 70-80.  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   Past Medical History:  Diagnosis Date   Abnormal Pap smear and cervical HPV (human papillomavirus)    Anemia    Phreesia 12/29/2020   Anxiety    [employer]reesia 12/29/2020   History of miscarriage    Hyperlipidemia    Hypertension    Panic disorder    Past Surgical History:  Procedure Laterality Date   FRACTURE SURGERY N/A    [employer]reesia 12/29/2020   TUBAL LIGATION     Current Outpatient Medications on File Prior to Visit  Medication Sig Dispense Refill   Aspirin-Acetaminophen-Caffeine (GOODYS EXTRA STRENGTH) 500-325-65 MG PACK Take 1 packet by mouth 2 (two) times daily as needed (Pain).     atorvastatin (LIPITOR) 20 MG tablet TAKE 1 TABLET BY MOUTH EVERY DAY 90 tablet 1   Cholecalciferol (VITAMIN D) 2000 UNITS CAPS Take 1 capsule by mouth daily.     clonazePAM (KLONOPIN) 0.5 MG tablet TAKE 1 TABLET BY MOUTH 3 TIMES DAILY AS NEEDED FOR ANXIETY. 90 tablet 0   levocetirizine (XYZAL ALLERGY 24HR) 5 MG tablet Take 1 tablet (5 mg total) by mouth every evening. 30 tablet 0   losartan (COZAAR) 50 MG tablet TAKE 1 TABLET BY MOUTH EVERY DAY 90 tablet 0   PARoxetine (PAXIL) 40 MG tablet TAKE 1 TABLET BY MOUTH EVERY DAY IN THE  MORNING 30 tablet 0   venlafaxine XR (EFFEXOR-XR) 75 MG 24 hr capsule TAKE 1 CAPSULE BY MOUTH DAILY WITH BREAKFAST. 90 capsule 1   No current facility-administered medications on file prior to visit.   Allergies  Allergen Reactions   Antihistamines, Chlorpheniramine-Type     Raised heart rate.   Social History   Socioeconomic History   Marital status: Married    Spouse name: Not on file   Number of children: Not on file   Years of education: Not on file   Highest education level: Not on file  Occupational History   Not on file  Tobacco Use   Smoking status: Every Day    Packs/day: 1.00    Types: Cigarettes   Smokeless tobacco: Never  Substance and Sexual Activity   Alcohol use: Yes   Drug use: No   Sexual activity: Not on file  Other Topics Concern   Not on file  Social History Narrative   Not on file   Social Determinants of Health   Financial Resource Strain: Not on file  Food Insecurity: Not on file  Transportation Needs: Not on file  Physical Activity: Not on file  Stress: Not on file  Social Connections: Not on file  Intimate  Partner Violence: Not on file      Review of Systems  All other systems reviewed and are negative.     Objective:   Physical Exam Vitals reviewed.  Constitutional:      General: She is not in acute distress.    Appearance: She is well-developed. She is not diaphoretic.  HENT:     Head: Normocephalic and atraumatic.     Right Ear: External ear normal.     Left Ear: External ear normal.     Nose: Nose normal.     Mouth/Throat:     Pharynx: No oropharyngeal exudate.  Eyes:     General: No scleral icterus.       Right eye: No discharge.        Left eye: No discharge.     Conjunctiva/sclera: Conjunctivae normal.     Pupils: Pupils are equal, round, and reactive to light.  Neck:     Thyroid: No thyromegaly.     Vascular: No JVD.     Trachea: No tracheal deviation.  Cardiovascular:     Rate and Rhythm: Normal rate and  regular rhythm.     Heart sounds: Normal heart sounds. No murmur heard.   No friction rub. No gallop.  Pulmonary:     Effort: Pulmonary effort is normal. No respiratory distress.     Breath sounds: Normal breath sounds. No stridor. No wheezing or rales.  Chest:     Chest wall: No tenderness.  Abdominal:     General: Bowel sounds are normal. There is no distension.     Palpations: Abdomen is soft. There is no mass.     Tenderness: There is no abdominal tenderness. There is no guarding or rebound.     Hernia: No hernia is present.  Musculoskeletal:        General: No tenderness or deformity. Normal range of motion.     Cervical back: Normal range of motion and neck supple.  Lymphadenopathy:     Cervical: No cervical adenopathy.  Skin:    General: Skin is warm.     Coloration: Skin is not pale.     Findings: No erythema or rash.  Neurological:     Mental Status: She is alert and oriented to person, place, and time.     Cranial Nerves: No cranial nerve deficit.     Sensory: No sensory deficit.     Motor: No abnormal muscle tone.     Coordination: Coordination normal.     Deep Tendon Reflexes: Reflexes normal.  Psychiatric:        Attention and Perception: Attention and perception normal.        Mood and Affect: Mood is anxious. Affect is tearful.        Speech: She is communicative. Speech is not rapid and pressured, slurred or tangential.        Behavior: Behavior normal.        Thought Content: Thought content normal.        Cognition and Memory: Cognition and memory normal.        Judgment: Judgment normal.          Assessment & Plan:  Screening-pulmonary TB - Plan: QuantiFERON-TB Gold Plus  Benign essential HTN  GAD (generalized anxiety disorder) Blood pressure at home sounds outstanding.  We will make no changes.  We will increase her Effexor to 150 mg every morning.  I asked her to try to wean down gradually off the Klonopin to just once  a day and then hopefully  discontinue altogether.  I am very happy that she is doing better.  Her blood pressure here slightly high however she has been checking at home and is well controlled at home.  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  I am really happy for her.

## 2022-05-12 ENCOUNTER — Other Ambulatory Visit: Payer: Self-pay | Admitting: Family Medicine

## 2022-05-15 LAB — QUANTIFERON-TB GOLD PLUS
Mitogen-NIL: >10 IU/mL
NIL: 0.06 IU/mL
QuantiFERON-TB Gold Plus: NEGATIVE
TB1-NIL: 0.03 IU/mL
TB2-NIL: 0 IU/mL

## 2022-05-16 ENCOUNTER — Telehealth: Payer: Self-pay

## 2022-05-16 NOTE — Telephone Encounter (Signed)
-----   Message from Susy Frizzle, MD sent at 05/16/2022  6:50 AM EDT ----- Tb test is negative

## 2022-05-16 NOTE — Telephone Encounter (Signed)
I left a message for the patient to return my call.  Regarding lab results 

## 2022-05-16 NOTE — Telephone Encounter (Signed)
Requested Prescriptions  Pending Prescriptions Disp Refills  . levocetirizine (XYZAL) 5 MG tablet [Pharmacy Med Name: LEVOCETIRIZINE 5 MG TABLET] 30 tablet 0    Sig: TAKE 1 TABLET BY MOUTH EVERY DAY IN THE EVENING     Ear, Nose, and Throat:  Antihistamines - levocetirizine dihydrochloride Passed - 05/12/2022 11:32 AM      Passed - Cr in normal range and within 360 days    Creat  Date Value Ref Range Status  03/21/2022 0.74 0.50 - 0.99 mg/dL Final         Passed - eGFR is 10 or above and within 360 days    GFR, Est African American  Date Value Ref Range Status  06/29/2020 113 > OR = 60 mL/min/1.60m Final   GFR, Est Non African American  Date Value Ref Range Status  06/29/2020 98 > OR = 60 mL/min/1.740mFinal   eGFR  Date Value Ref Range Status  03/21/2022 101 > OR = 60 mL/min/1.7334minal    Comment:    The eGFR is based on the CKD-EPI 2021 equation. To calculate  the new eGFR from a previous Creatinine or Cystatin C result, go to https://www.kidney.org/professionals/ kdoqi/gfr%5Fcalculator          Passed - Valid encounter within last 12 months    Recent Outpatient Visits          5 days ago Screening-pulmonary TB   [place] [place]   1 month ago Benign essential HTN   [facility] Mcge[facility]   3 months ago Bacterial sinusitis   [place]   3 months ago No-show for appointment   [facility]   1 year ago Viral upper respiratory tract infection   [place] [place]

## 2022-05-16 NOTE — Telephone Encounter (Signed)
Requested medication (s) are due for refill today: no   Requested medication (s) are on the active medication list: yes    Last refill: 05/11/22  #90  3 refills  Future visit scheduled no  Notes to clinic: Note from pharmacy, please review.     Pharmacy comment: Alternative Requested:PLEASE SEND CAPSULE FORM.  Requested Prescriptions  Pending Prescriptions Disp Refills   venlafaxine XR (EFFEXOR-XR) 37.5 MG 24 hr capsule [Pharmacy Med Name: VENLAFAXINE HCL ER 37.5 MG CAP]  0     Psychiatry: Antidepressants - SNRI - desvenlafaxine & venlafaxine Failed - 05/11/2022  3:53 PM      Failed - Last BP in normal range    BP Readings from Last 1 Encounters:  05/11/22 (!) 142/98         Failed - Lipid Panel in normal range within the last 12 months    Cholesterol  Date Value Ref Range Status  03/21/2022 227 (H) <200 mg/dL Final   LDL Cholesterol (Calc)  Date Value Ref Range Status  03/21/2022 111 (H) mg/dL (calc) Final    Comment:    Reference range: <100 . Desirable range <100 mg/dL for primary prevention;   <70 mg/dL for patients with CHD or diabetic patients  with > or = 2 CHD risk factors. Marland Kitchen LDL-C is now calculated using the Martin-Hopkins  calculation, which is a validated novel method providing  better accuracy than the Friedewald equation in the  estimation of LDL-C.  Horald Pollen et al. Lenox Ahr. 1478;295(62): 2061-2068  (http://education.[facility].com/faq/FAQ164)    HDL  Date Value Ref Range Status  03/21/2022 101 > OR = 50 mg/dL Final   Triglycerides  Date Value Ref Range Status  03/21/2022 68 <150 mg/dL Final         Passed - Cr in normal range and within 360 days    Creat  Date Value Ref Range Status  03/21/2022 0.74 0.50 - 0.99 mg/dL Final         Passed - Valid encounter within last 6 months    Recent Outpatient Visits           5 days ago Screening-pulmonary TB   [facility] Pickard, Priscille Heidelberg, MD   1 month ago Benign essential HTN    [facility] Donita Brooks, MD   3 months ago Bacterial sinusitis   [facility] Valentino Nose, NP   3 months ago No-show for appointment   [facility] Valentino Nose, NP   1 year ago Viral upper respiratory tract infection   [facility] Pickard, Priscille Heidelberg, MD

## 2022-05-21 ENCOUNTER — Other Ambulatory Visit: Payer: Self-pay | Admitting: Family Medicine

## 2022-05-21 DIAGNOSIS — I10 Essential (primary) hypertension: Secondary | ICD-10-CM

## 2022-05-22 NOTE — Telephone Encounter (Signed)
Requested medication (s) are due for refill today: Yes  Requested medication (s) are on the active medication list: Yes  Last refill:  04/21/22  Future visit scheduled: No  Notes to clinic:  Unable to refill per protocol, cannot delegate.      Requested Prescriptions  Pending Prescriptions Disp Refills   clonazePAM (KLONOPIN) 0.5 MG tablet [Pharmacy Med Name: CLONAZEPAM 0.5 MG TABLET] 90 tablet 0    Sig: TAKE 1 TABLET BY MOUTH THREE TIMES A DAY AS NEEDED FOR ANXIETY     Not Delegated - Psychiatry: Anxiolytics/Hypnotics 2 Failed - 05/21/2022 12:52 PM      Failed - This refill cannot be delegated      Failed - Urine Drug Screen completed in last 360 days      Passed - Patient is not pregnant      Passed - Valid encounter within last 6 months    Recent Outpatient Visits           1 week ago Screening-pulmonary TB   [place] Pickard, Cammie Mcgee, MD   2 months ago Benign essential HTN   [place], Warren T, MD   4 months ago Bacterial sinusitis   [place] Eulogio Bear, NP   4 months ago No-show for appointment   [place] Eulogio Bear, NP   1 year ago Viral upper respiratory tract infection   [place] Pickard, Cammie Mcgee, MD

## 2022-05-23 NOTE — Telephone Encounter (Signed)
Pt called in concerned about not getting this refill. Pt is afraid of going w/o this med. Pt stated she was just seen in office recently and will soon be out of this med. Pt would like a cb about trying to get this prescription filled please. Please advise.  Cb#: [phone]

## 2022-05-23 NOTE — Telephone Encounter (Signed)
Requested medication (s) are due for refill today - yes  Requested medication (s) are on the active medication list -yes  Future visit scheduled -no  Last refill: 04/21/22 #90  Notes to clinic: non delegated Rx  Requested Prescriptions  Pending Prescriptions Disp Refills   clonazePAM (KLONOPIN) 0.5 MG tablet [Pharmacy Med Name: CLONAZEPAM 0.5 MG TABLET] 90 tablet 0    Sig: TAKE 1 TABLET BY MOUTH THREE TIMES A DAY AS NEEDED FOR ANXIETY     Not Delegated - Psychiatry: Anxiolytics/Hypnotics 2 Failed - 05/23/2022  3:21 PM      Failed - This refill cannot be delegated      Failed - Urine Drug Screen completed in last 360 days      Passed - Patient is not pregnant      Passed - Valid encounter within last 6 months    Recent Outpatient Visits           1 week ago Screening-pulmonary TB   [facility] Pickard, Priscille Heidelberg, MD   2 months ago Benign essential HTN   [facility] Tanya Nones, Priscille Heidelberg, MD   4 months ago Bacterial sinusitis   [facility] Valentino Nose, NP   4 months ago No-show for appointment   [facility] Valentino Nose, NP   1 year ago Viral upper respiratory tract infection   [facility] Pickard, Priscille Heidelberg, MD                  Requested Prescriptions  Pending Prescriptions Disp Refills   clonazePAM (KLONOPIN) 0.5 MG tablet [Pharmacy Med Name: CLONAZEPAM 0.5 MG TABLET] 90 tablet 0    Sig: TAKE 1 TABLET BY MOUTH THREE TIMES A DAY AS NEEDED FOR ANXIETY     Not Delegated - Psychiatry: Anxiolytics/Hypnotics 2 Failed - 05/23/2022  3:21 PM      Failed - This refill cannot be delegated      Failed - Urine Drug Screen completed in last 360 days      Passed - Patient is not pregnant      Passed - Valid encounter within last 6 months    Recent Outpatient Visits           1 week ago Screening-pulmonary TB   [facility] Pickard, Priscille Heidelberg, MD   2 months ago  Benign essential HTN   [facility] Donita Brooks, MD   4 months ago Bacterial sinusitis   [facility] Valentino Nose, NP   4 months ago No-show for appointment   [facility] Valentino Nose, NP   1 year ago Viral upper respiratory tract infection   [facility] Pickard, Priscille Heidelberg, MD

## 2022-05-26 NOTE — Telephone Encounter (Signed)
Patient left a vm asking for this refill to be sent in. Sounded as if she was crying on the vm-she has left several messages over this week requesting a refill. She thinks it isn't right that we aren't sending in refill.  I have left vm asking her to return my call.I am going to explain that Dr. Dennard Schaumann is out of the  office this week.   CB# [phone]

## 2022-05-28 ENCOUNTER — Other Ambulatory Visit: Payer: Self-pay | Admitting: Family Medicine

## 2022-05-29 NOTE — Telephone Encounter (Signed)
Requested Prescriptions  Pending Prescriptions Disp Refills  . atorvastatin (LIPITOR) 20 MG tablet [Pharmacy Med Name: ATORVASTATIN 20 MG TABLET] 90 tablet 1    Sig: TAKE 1 TABLET BY MOUTH EVERY DAY     Cardiovascular:  Antilipid - Statins Failed - 05/29/2022  4:21 PM      Failed - Lipid Panel in normal range within the last 12 months    Cholesterol  Date Value Ref Range Status  03/21/2022 227 (H) <200 mg/dL Final   LDL Cholesterol (Calc)  Date Value Ref Range Status  03/21/2022 111 (H) mg/dL (calc) Final    Comment:    Reference range: <100 . Desirable range <100 mg/dL for primary prevention;   <70 mg/dL for patients with CHD or diabetic patients  with > or = 2 CHD risk factors. Marland Kitchen LDL-C is now calculated using the Martin-Hopkins  calculation, which is a validated novel method providing  better accuracy than the Friedewald equation in the  estimation of LDL-C.  Horald Pollen et al. Lenox Ahr. 5956;387(56): 2061-2068  (http://education.[facility].com/faq/FAQ164)    HDL  Date Value Ref Range Status  03/21/2022 101 > OR = 50 mg/dL Final   Triglycerides  Date Value Ref Range Status  03/21/2022 68 <150 mg/dL Final         Passed - Patient is not pregnant      Passed - Valid encounter within last 12 months    Recent Outpatient Visits          2 weeks ago Screening-pulmonary TB   [facility] Pickard, Priscille Heidelberg, MD   2 months ago Benign essential HTN   [facility] Donita Brooks, MD   4 months ago Bacterial sinusitis   [facility] Valentino Nose, NP   4 months ago No-show for appointment   [facility] Valentino Nose, NP   1 year ago Viral upper respiratory tract infection   [facility] Pickard, Priscille Heidelberg, MD

## 2022-06-27 ENCOUNTER — Other Ambulatory Visit: Payer: Self-pay | Admitting: Family Medicine

## 2022-06-27 DIAGNOSIS — I10 Essential (primary) hypertension: Secondary | ICD-10-CM

## 2022-06-28 NOTE — Telephone Encounter (Signed)
Requested medication (s) are due for refill today- yes  Requested medication (s) are on the active medication list -yes  Future visit scheduled -no  Last refill: 05/28/22 #90  Notes to clinic: non delegated Rx  Requested Prescriptions  Pending Prescriptions Disp Refills   clonazePAM (KLONOPIN) 0.5 MG tablet [Pharmacy Med Name: CLONAZEPAM 0.5 MG TABLET] 90 tablet 0    Sig: TAKE 1 TABLET BY MOUTH THREE TIMES A DAY AS NEEDED FOR ANXIETY     Not Delegated - Psychiatry: Anxiolytics/Hypnotics 2 Failed - 06/28/2022 10:04 AM      Failed - This refill cannot be delegated      Failed - Urine Drug Screen completed in last 360 days      Passed - Patient is not pregnant      Passed - Valid encounter within last 6 months    Recent Outpatient Visits           1 month ago Screening-pulmonary TB   [facility] Pickard, Priscille Heidelberg, MD   3 months ago Benign essential HTN   [facility] Tanya Nones, Priscille Heidelberg, MD   5 months ago Bacterial sinusitis   [facility] Valentino Nose, NP   5 months ago No-show for appointment   [facility] Valentino Nose, NP   1 year ago Viral upper respiratory tract infection   [facility] Pickard, Priscille Heidelberg, MD                 Requested Prescriptions  Pending Prescriptions Disp Refills   clonazePAM (KLONOPIN) 0.5 MG tablet [Pharmacy Med Name: CLONAZEPAM 0.5 MG TABLET] 90 tablet 0    Sig: TAKE 1 TABLET BY MOUTH THREE TIMES A DAY AS NEEDED FOR ANXIETY     Not Delegated - Psychiatry: Anxiolytics/Hypnotics 2 Failed - 06/28/2022 10:04 AM      Failed - This refill cannot be delegated      Failed - Urine Drug Screen completed in last 360 days      Passed - Patient is not pregnant      Passed - Valid encounter within last 6 months    Recent Outpatient Visits           1 month ago Screening-pulmonary TB   [facility] Pickard, Priscille Heidelberg, MD   3 months ago  Benign essential HTN   [facility] Donita Brooks, MD   5 months ago Bacterial sinusitis   [facility] Valentino Nose, NP   5 months ago No-show for appointment   [facility] Valentino Nose, NP   1 year ago Viral upper respiratory tract infection   [facility] Pickard, Priscille Heidelberg, MD

## 2022-06-30 ENCOUNTER — Other Ambulatory Visit: Payer: Self-pay | Admitting: Family Medicine

## 2022-06-30 DIAGNOSIS — I10 Essential (primary) hypertension: Secondary | ICD-10-CM

## 2022-06-30 MED ORDER — CLONAZEPAM 0.5 MG PO TABS: ORAL_TABLET | ORAL | 0 refills | Status: DC

## 2022-06-30 NOTE — Telephone Encounter (Signed)
Per pt stated that she has not been taking 3/day so if you want to cut her back to 2/day she will be fine wit that.  LOV 05/11/22 Last refill 05/28/22, #90, 0 refills  Please review, thanks!

## 2022-06-30 NOTE — Telephone Encounter (Signed)
Requested Prescriptions  Pending Prescriptions Disp Refills  . losartan (COZAAR) 50 MG tablet [Pharmacy Med Name: LOSARTAN POTASSIUM 50 MG TAB] 30 tablet 2    Sig: TAKE 1 TABLET BY MOUTH EVERY DAY     Cardiovascular:  Angiotensin Receptor Blockers Failed - 06/30/2022  3:42 PM      Failed - Last BP in normal range    BP Readings from Last 1 Encounters:  05/11/22 (!) 142/98         Passed - Cr in normal range and within 180 days    Creat  Date Value Ref Range Status  03/21/2022 0.74 0.50 - 0.99 mg/dL Final         Passed - K in normal range and within 180 days    Potassium  Date Value Ref Range Status  03/21/2022 4.6 3.5 - 5.3 mmol/L Final         Passed - Patient is not pregnant      Passed - Valid encounter within last 6 months    Recent Outpatient Visits          1 month ago Screening-pulmonary TB   [facility] Pickard, Priscille Heidelberg, MD   3 months ago Benign essential HTN   [facility] Donita Brooks, MD   5 months ago Bacterial sinusitis   [facility] Valentino Nose, NP   5 months ago No-show for appointment   [facility] Valentino Nose, NP   1 year ago Viral upper respiratory tract infection   [facility] Pickard, Priscille Heidelberg, MD

## 2022-07-12 ENCOUNTER — Telehealth: Payer: Self-pay

## 2022-07-12 MED ORDER — LEVOCETIRIZINE DIHYDROCHLORIDE 5 MG PO TABS: ORAL_TABLET | ORAL | 0 refills | Status: DC

## 2022-07-12 NOTE — Telephone Encounter (Signed)
Pharmacy faxed a refill request for levocetirizine (XYZAL) 5 MG tablet [[id number]]    Order Details Dose, Route, Frequency: As Directed  Dispense Quantity: 30 tablet Refills: 0        Sig: TAKE 1 TABLET BY MOUTH EVERY DAY IN THE EVENING       Start Date: 05/16/22 End Date: --  Written Date: 05/16/22 Expiration Date: 05/16/23

## 2022-08-02 ENCOUNTER — Other Ambulatory Visit: Payer: Self-pay | Admitting: Family Medicine

## 2022-08-02 DIAGNOSIS — I10 Essential (primary) hypertension: Secondary | ICD-10-CM

## 2022-08-02 NOTE — Telephone Encounter (Signed)
Requested medication (s) are due for refill today: yes  Requested medication (s) are on the active medication list: yes  Last refill:  06/30/22 #90 0 refills  Future visit scheduled: no  Notes to clinic:  not delegated per protocol. Do you want to refill Rx?     Requested Prescriptions  Pending Prescriptions Disp Refills   clonazePAM (KLONOPIN) 0.5 MG tablet [Pharmacy Med Name: CLONAZEPAM 0.5 MG TABLET] 90 tablet 0    Sig: TAKE 1 TABLET BY MOUTH THREE TIMES A DAY AS NEEDED FOR ANXIETY     Not Delegated - Psychiatry: Anxiolytics/Hypnotics 2 Failed - 08/02/2022  9:30 AM      Failed - This refill cannot be delegated      Failed - Urine Drug Screen completed in last 360 days      Passed - Patient is not pregnant      Passed - Valid encounter within last 6 months    Recent Outpatient Visits           2 months ago Screening-pulmonary TB   [facility] Pickard, Priscille Heidelberg, MD   4 months ago Benign essential HTN   [facility] Donita Brooks, MD   6 months ago Bacterial sinusitis   [facility] Valentino Nose, NP   6 months ago No-show for appointment   [facility] Valentino Nose, NP   1 year ago Viral upper respiratory tract infection   [facility] Pickard, Priscille Heidelberg, MD

## 2022-08-10 ENCOUNTER — Other Ambulatory Visit: Payer: Self-pay | Admitting: Family Medicine

## 2022-08-10 NOTE — Telephone Encounter (Signed)
Requested Prescriptions  Pending Prescriptions Disp Refills  . levocetirizine (XYZAL) 5 MG tablet [Pharmacy Med Name: LEVOCETIRIZINE 5 MG TABLET] 30 tablet 0    Sig: TAKE 1 TABLET BY MOUTH EVERY DAY IN THE EVENING     Ear, Nose, and Throat:  Antihistamines - levocetirizine dihydrochloride Passed - 08/10/2022 11:30 AM      Passed - Cr in normal range and within 360 days    Creat  Date Value Ref Range Status  03/21/2022 0.74 0.50 - 0.99 mg/dL Final         Passed - eGFR is 10 or above and within 360 days    GFR, Est African American  Date Value Ref Range Status  06/29/2020 113 > OR = 60 mL/min/1.66m Final   GFR, Est Non African American  Date Value Ref Range Status  06/29/2020 98 > OR = 60 mL/min/1.726mFinal   eGFR  Date Value Ref Range Status  03/21/2022 101 > OR = 60 mL/min/1.7398minal    Comment:    The eGFR is based on the CKD-EPI 2021 equation. To calculate  the new eGFR from a previous Creatinine or Cystatin C result, go to https://www.kidney.org/professionals/ kdoqi/gfr%5Fcalculator          Passed - Valid encounter within last 12 months    Recent Outpatient Visits          3 months ago Screening-pulmonary TB   [facility] Mcge[facility]   4 months ago Benign essential HTN   [facility] [facility]   6 months ago Bacterial sinusitis   [place] [place]   6 months ago No-show for appointment   [place]   1 year ago Viral upper respiratory tract infection   [place]

## 2022-09-04 ENCOUNTER — Other Ambulatory Visit: Payer: Self-pay | Admitting: Family Medicine

## 2022-09-04 DIAGNOSIS — I10 Essential (primary) hypertension: Secondary | ICD-10-CM

## 2022-10-06 ENCOUNTER — Other Ambulatory Visit: Payer: Self-pay | Admitting: Family Medicine

## 2022-10-06 DIAGNOSIS — I10 Essential (primary) hypertension: Secondary | ICD-10-CM

## 2022-10-06 NOTE — Telephone Encounter (Signed)
Requested medication (s) are due for refill today: yes  Requested medication (s) are on the active medication list: yes  Last refill:  09/04/22 #90  Future visit scheduled: no  Notes to clinic:  me not delegated to NT to reorder   Requested Prescriptions  Pending Prescriptions Disp Refills   clonazePAM (KLONOPIN) 0.5 MG tablet [Pharmacy Med Name: CLONAZEPAM 0.5 MG TABLET] 90 tablet 0    Sig: TAKE 1 TABLET BY MOUTH THREE TIMES A DAY AS NEEDED FOR ANXIETY     Not Delegated - Psychiatry: Anxiolytics/Hypnotics 2 Failed - 10/06/2022 11:38 AM      Failed - This refill cannot be delegated      Failed - Urine Drug Screen completed in last 360 days      Failed - Valid encounter within last 6 months    Recent Outpatient Visits           4 months ago Screening-pulmonary TB   [place] Susy Frizzle, MD   6 months ago Benign essential HTN   [place], Warren T, MD   8 months ago Bacterial sinusitis   [place] Eulogio Bear, NP   8 months ago No-show for appointment   [place] Eulogio Bear, NP   1 year ago Viral upper respiratory tract infection   [place], Cammie Mcgee, MD              Passed - Patient is not pregnant

## 2022-10-09 ENCOUNTER — Telehealth: Payer: Self-pay

## 2022-10-09 ENCOUNTER — Ambulatory Visit (INDEPENDENT_AMBULATORY_CARE_PROVIDER_SITE_OTHER): Payer: Commercial Managed Care - HMO | Admitting: Family Medicine

## 2022-10-09 ENCOUNTER — Other Ambulatory Visit: Payer: Self-pay

## 2022-10-09 ENCOUNTER — Other Ambulatory Visit: Payer: Self-pay | Admitting: Family Medicine

## 2022-10-09 DIAGNOSIS — J40 Bronchitis, not specified as acute or chronic: Secondary | ICD-10-CM

## 2022-10-09 DIAGNOSIS — F410 Panic disorder [episodic paroxysmal anxiety] without agoraphobia: Secondary | ICD-10-CM

## 2022-10-09 DIAGNOSIS — I10 Essential (primary) hypertension: Secondary | ICD-10-CM

## 2022-10-09 MED ORDER — AZITHROMYCIN 250 MG PO TABS: ORAL_TABLET | ORAL | 0 refills | Status: DC

## 2022-10-09 MED ORDER — LOSARTAN POTASSIUM 50 MG PO TABS: 50.0000 mg | ORAL_TABLET | Freq: Every day | ORAL | 2 refills | Status: DC

## 2022-10-09 MED ORDER — CLONAZEPAM 0.5 MG PO TABS: 0.5000 mg | ORAL_TABLET | Freq: Three times a day (TID) | ORAL | 0 refills | Status: DC | PRN

## 2022-10-09 MED ORDER — HYDROCODONE BIT-HOMATROP MBR 5-1.5 MG/5ML PO SOLN: 5.0000 mL | Freq: Three times a day (TID) | ORAL | 0 refills | Status: DC | PRN

## 2022-10-09 NOTE — Telephone Encounter (Signed)
Received eFax from pharmacy to request refill of or new script for  losartan (COZAAR) 50 MG tablet [[id number]]    Order Details Dose, Route, Frequency: As Directed  Dispense Quantity: 30 tablet Refills: 2        Sig: TAKE 1 TABLET BY MOUTH EVERY DAY       Start Date: 06/30/22 End Date: --  Written Date: 06/30/22 Expiration Date: 06/30/23  . PHARMACY: [id number] [place], [place] - [address] .  LOV: 1023/23

## 2022-10-09 NOTE — Telephone Encounter (Signed)
MEDICATION FOR COUGH: Pt called asking if a medication can be sent in for her to take at night to help her coughing? Pt states she is unable to lie down to rest without coughing. Thank you.

## 2022-10-09 NOTE — Progress Notes (Signed)
Subjective:    Patient ID: Elaine Johns, female    DOB: June 17, 1976, 46 y.o.   MRN: [id number]  Patient has severe whitecoat syndrome.  She states that her heart rate at home.  As soon as she comes to the doctor's office, her heart rate "takes off".  She states that her heart rate has been normal this morning however is 130 bpm here but she attributes this to feeling extremely nervous in the doctor's office.  She states that she developed a cold about 10 days ago.  Symptoms included head congestion and cough.  However she was gradually improving.  Then at the end of last week, she started developing secondary fevers, worsening cough, and white purulent sputum.  She took a negative COVID test. Past Medical History:  Diagnosis Date   Abnormal Pap smear and cervical HPV (human papillomavirus)    Anemia    [employer] 12/29/2020   Anxiety    [employer] 12/29/2020   History of miscarriage    Hyperlipidemia    Hypertension    Panic disorder    Past Surgical History:  Procedure Laterality Date   FRACTURE SURGERY N/A    [employer] 12/29/2020   TUBAL LIGATION     Current Outpatient Medications on File Prior to Visit  Medication Sig Dispense Refill   Aspirin-Acetaminophen-Caffeine (GOODYS EXTRA STRENGTH) 500-325-65 MG PACK Take 1 packet by mouth 2 (two) times daily as needed (Pain).     atorvastatin (LIPITOR) 20 MG tablet TAKE 1 TABLET BY MOUTH EVERY DAY 90 tablet 1   Cholecalciferol (VITAMIN D) 2000 UNITS CAPS Take 1 capsule by mouth daily.     clonazePAM (KLONOPIN) 0.5 MG tablet TAKE 1 TABLET BY MOUTH THREE TIMES A DAY AS NEEDED FOR ANXIETY 90 tablet 0   levocetirizine (XYZAL) 5 MG tablet TAKE 1 TABLET BY MOUTH EVERY DAY IN THE EVENING 90 tablet 1   losartan (COZAAR) 50 MG tablet TAKE 1 TABLET BY MOUTH EVERY DAY 30 tablet 2   PARoxetine (PAXIL) 40 MG tablet TAKE 1 TABLET BY MOUTH EVERY DAY IN THE MORNING 30 tablet 0   venlafaxine XR (EFFEXOR-XR) 150 MG 24 hr capsule Take 1 capsule (150 mg  total) by mouth daily with breakfast. 90 capsule 3   venlafaxine XR (EFFEXOR-XR) 75 MG 24 hr capsule TAKE 1 CAPSULE BY MOUTH DAILY WITH BREAKFAST. 90 capsule 1   No current facility-administered medications on file prior to visit.   Allergies  Allergen Reactions   Antihistamines, Chlorpheniramine-Type     Raised heart rate.   Social History   Socioeconomic History   Marital status: Married    Spouse name: Not on file   Number of children: Not on file   Years of education: Not on file   Highest education level: Not on file  Occupational History   Not on file  Tobacco Use   Smoking status: Every Day    Packs/day: 1.00    Types: Cigarettes   Smokeless tobacco: Never  Substance and Sexual Activity   Alcohol use: Yes   Drug use: No   Sexual activity: Not on file  Other Topics Concern   Not on file  Social History Narrative   Not on file   Social Determinants of Health   Financial Resource Strain: Not on file  Food Insecurity: Not on file  Transportation Needs: Not on file  Physical Activity: Not on file  Stress: Not on file  Social Connections: Not on file  Intimate Partner Violence: Not on  file      Review of Systems  All other systems reviewed and are negative.      Objective:   Physical Exam Vitals reviewed.  Constitutional:      General: She is not in acute distress.    Appearance: She is well-developed. She is not diaphoretic.  HENT:     Head: Normocephalic and atraumatic.     Right Ear: External ear normal.     Left Ear: External ear normal.     Nose: Nose normal.     Mouth/Throat:     Pharynx: No oropharyngeal exudate.  Eyes:     General: No scleral icterus.       Right eye: No discharge.        Left eye: No discharge.     Conjunctiva/sclera: Conjunctivae normal.     Pupils: Pupils are equal, round, and reactive to light.  Neck:     Thyroid: No thyromegaly.     Vascular: No JVD.     Trachea: No tracheal deviation.  Cardiovascular:     Rate  and Rhythm: Normal rate and regular rhythm.     Heart sounds: Normal heart sounds. No murmur heard.    No friction rub. No gallop.  Pulmonary:     Effort: Pulmonary effort is normal. No respiratory distress.     Breath sounds: Normal breath sounds. No stridor. No wheezing or rales.  Chest:     Chest wall: No tenderness.  Abdominal:     General: Bowel sounds are normal. There is no distension.     Palpations: Abdomen is soft. There is no mass.     Tenderness: There is no abdominal tenderness. There is no guarding or rebound.     Hernia: No hernia is present.  Musculoskeletal:        General: No tenderness or deformity. Normal range of motion.     Cervical back: Normal range of motion and neck supple.  Lymphadenopathy:     Cervical: No cervical adenopathy.  Skin:    General: Skin is warm.     Coloration: Skin is not pale.     Findings: No erythema or rash.  Neurological:     Mental Status: She is alert and oriented to person, place, and time.     Cranial Nerves: No cranial nerve deficit.     Sensory: No sensory deficit.     Motor: No abnormal muscle tone.     Coordination: Coordination normal.     Deep Tendon Reflexes: Reflexes normal.  Psychiatric:        Attention and Perception: Attention and perception normal.        Behavior: Behavior normal.        Thought Content: Thought content normal.        Cognition and Memory: Cognition and memory normal.        Judgment: Judgment normal.           Assessment & Plan:  Bronchitis - Plan: clonazePAM (KLONOPIN) 0.5 MG tablet I do not appreciate any pneumonia on exam but I am concerned that her symptoms worsen after 9 or 10 days that she may have developed a secondary bacterial infection such as bronchitis.  I will start the patient on a Z-Pak for bronchitis and I went ahead and refilled her Klonopin that she uses for anxiety disorder.  If symptoms worsen I would recommend a chest x-ray.  She is already had a negative COVID test.

## 2022-10-10 ENCOUNTER — Telehealth: Payer: Self-pay | Admitting: Family Medicine

## 2022-10-10 ENCOUNTER — Other Ambulatory Visit: Payer: Self-pay | Admitting: Family Medicine

## 2022-10-10 MED ORDER — ALBUTEROL SULFATE HFA 108 (90 BASE) MCG/ACT IN AERS: 2.0000 | INHALATION_SPRAY | Freq: Four times a day (QID) | RESPIRATORY_TRACT | 0 refills | Status: DC | PRN

## 2022-10-10 NOTE — Telephone Encounter (Signed)
Patient called to follow up on yesterday's visit as instructed by Dr. Dennard Schaumann. Patient is hoarse, wheezing somewhat, chest feels tight, and she's coughing up dark green phlegm. Patient stated she's taking the antibiotics as prescribed and is requesting an albuterol inhaler rather than prednisone; stated she doesn't like the way prednisone makes her feel.  Pharmacy confirmed as  [id number] - [place], [place]  [address] [place] [address]  Phone:  [phone]  Fax:  [phone]  DEA #:  [id number]  Please advise at [phone].

## 2022-10-16 ENCOUNTER — Encounter (HOSPITAL_COMMUNITY): Payer: Self-pay

## 2022-10-16 ENCOUNTER — Emergency Department (HOSPITAL_COMMUNITY): Payer: Commercial Managed Care - HMO

## 2022-10-16 ENCOUNTER — Emergency Department (HOSPITAL_COMMUNITY)
Admission: EM | Admit: 2022-10-16 | Discharge: 2022-10-16 | Disposition: A | Discharge status: Home / Self Care | Payer: Commercial Managed Care - HMO | Attending: Emergency Medicine | Admitting: Emergency Medicine

## 2022-10-16 DIAGNOSIS — Z79899 Other long term (current) drug therapy: Secondary | ICD-10-CM | POA: Diagnosis not present

## 2022-10-16 DIAGNOSIS — Z23 Encounter for immunization: Secondary | ICD-10-CM | POA: Insufficient documentation

## 2022-10-16 DIAGNOSIS — R0789 Other chest pain: Secondary | ICD-10-CM | POA: Insufficient documentation

## 2022-10-16 DIAGNOSIS — Y92410 Unspecified street and highway as the place of occurrence of the external cause: Secondary | ICD-10-CM | POA: Insufficient documentation

## 2022-10-16 DIAGNOSIS — S5012XA Contusion of left forearm, initial encounter: Secondary | ICD-10-CM | POA: Diagnosis not present

## 2022-10-16 DIAGNOSIS — F10120 Alcohol abuse with intoxication, uncomplicated: Secondary | ICD-10-CM | POA: Diagnosis not present

## 2022-10-16 DIAGNOSIS — I10 Essential (primary) hypertension: Secondary | ICD-10-CM | POA: Insufficient documentation

## 2022-10-16 DIAGNOSIS — S59912A Unspecified injury of left forearm, initial encounter: Secondary | ICD-10-CM | POA: Diagnosis present

## 2022-10-16 DIAGNOSIS — F10920 Alcohol use, unspecified with intoxication, uncomplicated: Secondary | ICD-10-CM

## 2022-10-16 MED ORDER — TETANUS-DIPHTH-ACELL PERTUSSIS 5-2.5-18.5 LF-MCG/0.5 IM SUSY
0.5000 mL | PREFILLED_SYRINGE | Freq: Once | INTRAMUSCULAR | Status: AC
  Administered 2022-10-16: 0.5 mL via INTRAMUSCULAR
  Filled 2022-10-16: qty 0.5
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
## 2022-10-27 ENCOUNTER — Encounter (HOSPITAL_COMMUNITY): Payer: Self-pay

## 2022-10-27 ENCOUNTER — Ambulatory Visit (HOSPITAL_COMMUNITY)
Admission: EM | Admit: 2022-10-27 | Discharge: 2022-10-27 | Disposition: A | Discharge status: Home / Self Care | Payer: Commercial Managed Care - HMO | Attending: Physician Assistant | Admitting: Physician Assistant

## 2022-10-27 DIAGNOSIS — S5012XD Contusion of left forearm, subsequent encounter: Secondary | ICD-10-CM | POA: Diagnosis not present

## 2022-10-27 MED ORDER — HYDROCODONE-ACETAMINOPHEN 5-325 MG PO TABS: 1.0000 | ORAL_TABLET | ORAL | 0 refills | Status: DC | PRN
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
## 2022-10-27 NOTE — Discharge Instructions (Addendum)
You may have a foreign body in the skin.  Schedule to see the Orthopaedist if pain and swelling persist

## 2022-10-31 ENCOUNTER — Ambulatory Visit (INDEPENDENT_AMBULATORY_CARE_PROVIDER_SITE_OTHER): Payer: Commercial Managed Care - HMO | Admitting: Family Medicine

## 2022-10-31 VITALS — BP 112/62 | HR 123 | Ht 59.0 in | Wt 115.4 lb

## 2022-10-31 DIAGNOSIS — S5012XD Contusion of left forearm, subsequent encounter: Secondary | ICD-10-CM
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
## 2022-11-07 ENCOUNTER — Other Ambulatory Visit: Payer: Self-pay | Admitting: Family Medicine

## 2022-11-07 DIAGNOSIS — J40 Bronchitis, not specified as acute or chronic: Secondary | ICD-10-CM

## 2022-11-07 NOTE — Telephone Encounter (Signed)
Requested medications are due for refill today.  Unsure  Requested medications are on the active medications list.  yes  Last refill. 10/09/2022 #90 0 rf  Future visit scheduled.   no  Notes to clinic.  Refill not delegated    Requested Prescriptions  Pending Prescriptions Disp Refills   clonazePAM (KLONOPIN) 0.5 MG tablet [Pharmacy Med Name: CLONAZEPAM 0.5 MG TABLET] 90 tablet 0    Sig: Take 1 tablet (0.5 mg total) by mouth 3 (three) times daily as needed for anxiety.     Not Delegated - Psychiatry: Anxiolytics/Hypnotics 2 Failed - 11/07/2022 12:37 PM      Failed - This refill cannot be delegated      Failed - Urine Drug Screen completed in last 360 days      Failed - Valid encounter within last 6 months    Recent Outpatient Visits           6 months ago Screening-pulmonary TB   [facility] Tanya Nones, Priscille Heidelberg, MD   7 months ago Benign essential HTN   [facility] Donita Brooks, MD   9 months ago Bacterial sinusitis   [facility] Valentino Nose, NP   9 months ago No-show for appointment   [facility] Valentino Nose, NP   1 year ago Viral upper respiratory tract infection   [facility] Pickard, Priscille Heidelberg, MD              Passed - Patient is not pregnant

## 2022-11-21 ENCOUNTER — Other Ambulatory Visit: Payer: Self-pay | Admitting: Family Medicine

## 2022-11-21 DIAGNOSIS — J40 Bronchitis, not specified as acute or chronic: Secondary | ICD-10-CM

## 2022-12-02 ENCOUNTER — Other Ambulatory Visit: Payer: Self-pay | Admitting: Family Medicine

## 2022-12-04 ENCOUNTER — Other Ambulatory Visit: Payer: Self-pay | Admitting: Family Medicine

## 2022-12-04 DIAGNOSIS — J40 Bronchitis, not specified as acute or chronic: Secondary | ICD-10-CM

## 2022-12-07 ENCOUNTER — Telehealth: Payer: Self-pay

## 2022-12-07 ENCOUNTER — Other Ambulatory Visit: Payer: Self-pay | Admitting: Family Medicine

## 2022-12-07 DIAGNOSIS — J40 Bronchitis, not specified as acute or chronic: Secondary | ICD-10-CM

## 2022-12-07 MED ORDER — CLONAZEPAM 0.5 MG PO TABS: 0.5000 mg | ORAL_TABLET | Freq: Three times a day (TID) | ORAL | 0 refills | Status: DC | PRN

## 2022-12-07 NOTE — Telephone Encounter (Signed)
Pt called requesting a refill on her Clonazepam 0.5 mg. Last RF was 11/23/2022 but was only a 10 day supply. Pt asks if she can have her full refill sent in? [facility] [address] Thank you.

## 2022-12-30 ENCOUNTER — Other Ambulatory Visit: Payer: Self-pay | Admitting: Family Medicine

## 2022-12-30 DIAGNOSIS — F410 Panic disorder [episodic paroxysmal anxiety] without agoraphobia: Secondary | ICD-10-CM

## 2022-12-30 DIAGNOSIS — I10 Essential (primary) hypertension: Secondary | ICD-10-CM

## 2023-01-01 NOTE — Telephone Encounter (Signed)
Requested Prescriptions  Pending Prescriptions Disp Refills   losartan (COZAAR) 50 MG tablet [Pharmacy Med Name: LOSARTAN POTASSIUM 50 MG TAB] 90 tablet 0    Sig: TAKE 1 TABLET BY MOUTH EVERY DAY     Cardiovascular:  Angiotensin Receptor Blockers Failed - 12/30/2022  8:33 AM      Failed - Cr in normal range and within 180 days    Creat  Date Value Ref Range Status  03/21/2022 0.74 0.50 - 0.99 mg/dL Final         Failed - K in normal range and within 180 days    Potassium  Date Value Ref Range Status  03/21/2022 4.6 3.5 - 5.3 mmol/L Final         Failed - Valid encounter within last 6 months    Recent Outpatient Visits           7 months ago Screening-pulmonary TB   [place] Susy Frizzle, MD   9 months ago Benign essential HTN   [place] Susy Frizzle, MD   11 months ago Bacterial sinusitis   [place] Eulogio Bear, NP   11 months ago No-show for appointment   [place] Eulogio Bear, NP   2 years ago Viral upper respiratory tract infection   [place], Cammie Mcgee, MD              Passed - Patient is not pregnant      Passed - Last BP in normal range    BP Readings from Last 1 Encounters:  10/31/22 112/62

## 2023-01-06 ENCOUNTER — Other Ambulatory Visit: Payer: Self-pay | Admitting: Family Medicine

## 2023-01-06 DIAGNOSIS — J40 Bronchitis, not specified as acute or chronic: Secondary | ICD-10-CM

## 2023-02-07 ENCOUNTER — Other Ambulatory Visit: Payer: Self-pay | Admitting: Family Medicine

## 2023-02-07 DIAGNOSIS — J40 Bronchitis, not specified as acute or chronic: Secondary | ICD-10-CM

## 2023-02-07 NOTE — Telephone Encounter (Signed)
Requested medication (s) are due for refill today - yes  Requested medication (s) are on the active medication list -yes  Future visit scheduled no  Last refill: 01/08/23 #90   Notes to clinic: non delegated Rx  Requested Prescriptions  Pending Prescriptions Disp Refills   clonazePAM (KLONOPIN) 0.5 MG tablet [Pharmacy Med Name: CLONAZEPAM 0.5 MG TABLET] 90 tablet 0    Sig: TAKE 1 TABLET (0.5 MG TOTAL) BY MOUTH 3 (THREE) TIMES DAILY AS NEEDED FOR ANXIETY.     Not Delegated - Psychiatry: Anxiolytics/Hypnotics 2 Failed - 02/07/2023  2:50 PM      Failed - This refill cannot be delegated      Failed - Urine Drug Screen completed in last 360 days      Failed - Valid encounter within last 6 months    Recent Outpatient Visits           9 months ago Screening-pulmonary TB   [place] Pickard, Cammie Mcgee, MD   10 months ago Benign essential HTN   [place] Dennard Schaumann, Cammie Mcgee, MD   1 year ago Bacterial sinusitis   [place] Eulogio Bear, NP   1 year ago No-show for appointment   [place] Eulogio Bear, NP   2 years ago Viral upper respiratory tract infection   [place] Pickard, Cammie Mcgee, MD              Passed - Patient is not pregnant         Requested Prescriptions  Pending Prescriptions Disp Refills   clonazePAM (KLONOPIN) 0.5 MG tablet [Pharmacy Med Name: CLONAZEPAM 0.5 MG TABLET] 90 tablet 0    Sig: TAKE 1 TABLET (0.5 MG TOTAL) BY MOUTH 3 (THREE) TIMES DAILY AS NEEDED FOR ANXIETY.     Not Delegated - Psychiatry: Anxiolytics/Hypnotics 2 Failed - 02/07/2023  2:50 PM      Failed - This refill cannot be delegated      Failed - Urine Drug Screen completed in last 360 days      Failed - Valid encounter within last 6 months    Recent Outpatient Visits           9 months ago Screening-pulmonary TB   [place] Pickard, Cammie Mcgee, MD   10 months ago  Benign essential HTN   [place] Dennard Schaumann, Cammie Mcgee, MD   1 year ago Bacterial sinusitis   [place] Eulogio Bear, NP   1 year ago No-show for appointment   [place] Eulogio Bear, NP   2 years ago Viral upper respiratory tract infection   [place], Cammie Mcgee, MD              Passed - Patient is not pregnant

## 2023-02-12 NOTE — Telephone Encounter (Signed)
Patient called to follow up on refill request; stated she just took her last pill today.  Please advise at [phone]

## 2023-02-13 ENCOUNTER — Other Ambulatory Visit: Payer: Self-pay | Admitting: Family Medicine

## 2023-02-13 ENCOUNTER — Telehealth: Payer: Self-pay

## 2023-02-13 DIAGNOSIS — J40 Bronchitis, not specified as acute or chronic: Secondary | ICD-10-CM

## 2023-02-13 MED ORDER — CLONAZEPAM 0.5 MG PO TABS: 0.5000 mg | ORAL_TABLET | Freq: Three times a day (TID) | ORAL | 0 refills | Status: DC | PRN

## 2023-02-13 NOTE — Telephone Encounter (Signed)
Pt asks for a refill on her Clonazepam 0.5 mg. Last RF was 01/08/2023. Last OV was 03/21/2022. Thank you.  [facility] [address].

## 2023-03-15 ENCOUNTER — Other Ambulatory Visit: Payer: Self-pay | Admitting: Family Medicine

## 2023-03-15 DIAGNOSIS — J40 Bronchitis, not specified as acute or chronic: Secondary | ICD-10-CM

## 2023-04-04 ENCOUNTER — Other Ambulatory Visit: Payer: Self-pay | Admitting: Family Medicine

## 2023-04-04 DIAGNOSIS — F410 Panic disorder [episodic paroxysmal anxiety] without agoraphobia: Secondary | ICD-10-CM

## 2023-04-04 DIAGNOSIS — I10 Essential (primary) hypertension: Secondary | ICD-10-CM

## 2023-04-13 ENCOUNTER — Other Ambulatory Visit: Payer: Self-pay | Admitting: Family Medicine

## 2023-04-13 DIAGNOSIS — J40 Bronchitis, not specified as acute or chronic: Secondary | ICD-10-CM

## 2023-04-13 NOTE — Telephone Encounter (Signed)
Requested medication (s) are due for refill today- yes  Requested medication (s) are on the active medication list -yes  Future visit scheduled -no  Last refill: 03/15/23 #90  Notes to clinic: non delegated Rx  Requested Prescriptions  Pending Prescriptions Disp Refills   clonazePAM (KLONOPIN) 0.5 MG tablet [Pharmacy Med Name: CLONAZEPAM 0.5 MG TABLET] 90 tablet 0    Sig: TAKE 1 TABLET (0.5 MG TOTAL) BY MOUTH 3 (THREE) TIMES DAILY AS NEEDED FOR ANXIETY.     Not Delegated - Psychiatry: Anxiolytics/Hypnotics 2 Failed - 04/13/2023  8:26 AM      Failed - This refill cannot be delegated      Failed - Urine Drug Screen completed in last 360 days      Failed - Valid encounter within last 6 months    Recent Outpatient Visits           11 months ago Screening-pulmonary TB   [facility] Pickard, Priscille Heidelberg, MD   1 year ago Benign essential HTN   [facility] Tanya Nones, Priscille Heidelberg, MD   1 year ago Bacterial sinusitis   [facility] Valentino Nose, NP   1 year ago No-show for appointment   [facility] Valentino Nose, NP   2 years ago Viral upper respiratory tract infection   [facility] Pickard, Priscille Heidelberg, MD              Passed - Patient is not pregnant         Requested Prescriptions  Pending Prescriptions Disp Refills   clonazePAM (KLONOPIN) 0.5 MG tablet [Pharmacy Med Name: CLONAZEPAM 0.5 MG TABLET] 90 tablet 0    Sig: TAKE 1 TABLET (0.5 MG TOTAL) BY MOUTH 3 (THREE) TIMES DAILY AS NEEDED FOR ANXIETY.     Not Delegated - Psychiatry: Anxiolytics/Hypnotics 2 Failed - 04/13/2023  8:26 AM      Failed - This refill cannot be delegated      Failed - Urine Drug Screen completed in last 360 days      Failed - Valid encounter within last 6 months    Recent Outpatient Visits           11 months ago Screening-pulmonary TB   [facility] Pickard, Priscille Heidelberg, MD   1 year ago  Benign essential HTN   [facility] Donita Brooks, MD   1 year ago Bacterial sinusitis   [facility] Valentino Nose, NP   1 year ago No-show for appointment   [facility] Valentino Nose, NP   2 years ago Viral upper respiratory tract infection   [facility] Pickard, Priscille Heidelberg, MD              Passed - Patient is not pregnant

## 2023-04-14 ENCOUNTER — Other Ambulatory Visit: Payer: Self-pay | Admitting: Family Medicine

## 2023-04-16 NOTE — Telephone Encounter (Signed)
Requested medication (s) are due for refill today: yes  Requested medication (s) are on the active medication list: yes  Last refill:  08/10/22  Future visit scheduled: no  Notes to clinic:  Called patient to schedule OV, no answer. Left VM to call back and schedule. Routing for approval.     Requested Prescriptions  Pending Prescriptions Disp Refills   levocetirizine (XYZAL) 5 MG tablet [Pharmacy Med Name: LEVOCETIRIZINE 5 MG TABLET] 90 tablet 2    Sig: TAKE 1 TABLET BY MOUTH EVERY DAY IN THE EVENING     Ear, Nose, and Throat:  Antihistamines - levocetirizine dihydrochloride Failed - 04/14/2023  2:31 PM      Failed - Cr in normal range and within 360 days    Creat  Date Value Ref Range Status  03/21/2022 0.74 0.50 - 0.99 mg/dL Final         Failed - eGFR is 10 or above and within 360 days    GFR, Est African American  Date Value Ref Range Status  06/29/2020 113 > OR = 60 mL/min/1.47m2 Final   GFR, Est Non African American  Date Value Ref Range Status  06/29/2020 98 > OR = 60 mL/min/1.52m2 Final   eGFR  Date Value Ref Range Status  03/21/2022 101 > OR = 60 mL/min/1.23m2 Final    Comment:    The eGFR is based on the CKD-EPI 2021 equation. To calculate  the new eGFR from a previous Creatinine or Cystatin C result, go to https://www.kidney.org/professionals/ kdoqi/gfr%5Fcalculator          Failed - Valid encounter within last 12 months    Recent Outpatient Visits           11 months ago Screening-pulmonary TB   [facility] Pickard, Priscille Heidelberg, MD   1 year ago Benign essential HTN   [facility] Tanya Nones, Priscille Heidelberg, MD   1 year ago Bacterial sinusitis   [facility] Valentino Nose, NP   1 year ago No-show for appointment   [facility] Valentino Nose, NP   2 years ago Viral upper respiratory tract infection   [facility] Pickard, Priscille Heidelberg, MD

## 2023-04-20 ENCOUNTER — Ambulatory Visit: Payer: Commercial Managed Care - HMO | Admitting: Family Medicine

## 2023-05-14 ENCOUNTER — Other Ambulatory Visit: Payer: Self-pay | Admitting: Family Medicine

## 2023-05-14 DIAGNOSIS — J40 Bronchitis, not specified as acute or chronic: Secondary | ICD-10-CM

## 2023-05-15 ENCOUNTER — Other Ambulatory Visit: Payer: Self-pay | Admitting: Family Medicine

## 2023-05-15 ENCOUNTER — Telehealth: Payer: Self-pay

## 2023-05-15 DIAGNOSIS — J40 Bronchitis, not specified as acute or chronic: Secondary | ICD-10-CM

## 2023-05-15 MED ORDER — CLONAZEPAM 0.5 MG PO TABS: 0.5000 mg | ORAL_TABLET | Freq: Three times a day (TID) | ORAL | 0 refills | Status: DC | PRN

## 2023-05-15 NOTE — Telephone Encounter (Signed)
Pt called in to request a refill of this med please:  clonazePAM (KLONOPIN) 0.5 MG tablet [[id number]]  LOV: 04/20/23  [facility]: [id number] [place], [place] - [address] [place] [address] Phone: [phone]  Fax: [phone]   Cb#: [phone]

## 2023-05-16 ENCOUNTER — Other Ambulatory Visit: Payer: Self-pay | Admitting: Family Medicine

## 2023-05-16 NOTE — Telephone Encounter (Signed)
Requested Prescriptions  Pending Prescriptions Disp Refills   venlafaxine XR (EFFEXOR-XR) 150 MG 24 hr capsule [Pharmacy Med Name: VENLAFAXINE HCL ER 150 MG CAP] 90 capsule 0    Sig: TAKE 1 CAPSULE BY MOUTH DAILY WITH BREAKFAST.     Psychiatry: Antidepressants - SNRI - desvenlafaxine & venlafaxine Failed - 05/16/2023  2:38 AM      Failed - Cr in normal range and within 360 days    Creat  Date Value Ref Range Status  03/21/2022 0.74 0.50 - 0.99 mg/dL Final         Failed - Valid encounter within last 6 months    Recent Outpatient Visits           1 year ago Screening-pulmonary TB   [facility] Pickard, Priscille Heidelberg, MD   1 year ago Benign essential HTN   [facility] Tanya Nones, Priscille Heidelberg, MD   1 year ago Bacterial sinusitis   [facility] Valentino Nose, NP   1 year ago No-show for appointment   [facility] Valentino Nose, NP   2 years ago Viral upper respiratory tract infection   [facility] Pickard, Priscille Heidelberg, MD       Future Appointments             In 1 week Tanya Nones, Priscille Heidelberg, MD [facility]            Failed - Lipid Panel in normal range within the last 12 months    Cholesterol  Date Value Ref Range Status  03/21/2022 227 (H) <200 mg/dL Final   LDL Cholesterol (Calc)  Date Value Ref Range Status  03/21/2022 111 (H) mg/dL (calc) Final    Comment:    Reference range: <100 . Desirable range <100 mg/dL for primary prevention;   <70 mg/dL for patients with CHD or diabetic patients  with > or = 2 CHD risk factors. Marland Kitchen LDL-C is now calculated using the Martin-Hopkins  calculation, which is a validated novel method providing  better accuracy than the Friedewald equation in the  estimation of LDL-C.  Horald Pollen et al. Lenox Ahr. 1610;960(45): 2061-2068  (http://education.[facility].com/faq/FAQ164)    HDL  Date Value Ref Range Status  03/21/2022  101 > OR = 50 mg/dL Final   Triglycerides  Date Value Ref Range Status  03/21/2022 68 <150 mg/dL Final         Passed - Last BP in normal range    BP Readings from Last 1 Encounters:  10/31/22 112/62

## 2023-05-28 ENCOUNTER — Ambulatory Visit (INDEPENDENT_AMBULATORY_CARE_PROVIDER_SITE_OTHER): Payer: 59 | Admitting: Family Medicine

## 2023-05-28 ENCOUNTER — Encounter: Payer: Self-pay | Admitting: Family Medicine

## 2023-05-28 VITALS — BP 122/82 | HR 76 | Temp 98.2°F | Ht 59.0 in | Wt 109.2 lb

## 2023-05-28 DIAGNOSIS — F411 Generalized anxiety disorder: Secondary | ICD-10-CM | POA: Diagnosis not present

## 2023-05-28 DIAGNOSIS — E7800 Pure hypercholesterolemia, unspecified: Secondary | ICD-10-CM | POA: Diagnosis not present

## 2023-05-28 DIAGNOSIS — Z1211 Encounter for screening for malignant neoplasm of colon: Secondary | ICD-10-CM

## 2023-05-28 DIAGNOSIS — M79671 Pain in right foot: Secondary | ICD-10-CM | POA: Diagnosis not present

## 2023-05-28 DIAGNOSIS — I10 Essential (primary) hypertension: Secondary | ICD-10-CM | POA: Diagnosis not present

## 2023-05-28 DIAGNOSIS — Z0001 Encounter for general adult medical examination with abnormal findings: Secondary | ICD-10-CM | POA: Diagnosis not present

## 2023-05-28 DIAGNOSIS — N912 Amenorrhea, unspecified: Secondary | ICD-10-CM | POA: Diagnosis not present

## 2023-05-28 DIAGNOSIS — Z0000 Encounter for general adult medical examination without abnormal findings: Secondary | ICD-10-CM

## 2023-05-28 DIAGNOSIS — Z124 Encounter for screening for malignant neoplasm of cervix: Secondary | ICD-10-CM | POA: Diagnosis not present

## 2023-05-28 DIAGNOSIS — Z1231 Encounter for screening mammogram for malignant neoplasm of breast: Secondary | ICD-10-CM

## 2023-05-28 NOTE — Progress Notes (Signed)
Subjective:    Patient ID: Elaine Johns, female    DOB: Jan 31, 1976, 47 y.o.   MRN: [id number]  Patient is a very pleasant 47 year old Caucasian female here today for complete physical exam.  She is overdue for her Pap smear.  She has not had a mammogram in more than 2 years so this is due as well.  She also has a family history of a father who had colon cancer.  She has not had a colonoscopy since 2007.  We discussed this at length today.  I strongly recommended a colonoscopy however the patient prefers to do Cologuard due to work issues.  I explained to the patient that there is a possibility of a false negative with Cologuard screening however the patient prefers this first.  She also complains of pain in her right foot.  She has a hard bony nodule on the dorsal aspect of the right first MTP joint.  This is not a bunion.  Is extremely tender to palpation.  She states that it hurts after she is on her feet for a long time at work.  She would like to get an x-ray of this.  Her blood pressure is excellent and she has been monitoring this at home.  She also reports that her periods.  Female for more than 6 months.  She is having occasional hot flashes but she would like her hormone levels checked. Past Medical History:  Diagnosis Date   Abnormal Pap smear and cervical HPV (human papillomavirus)    Anemia    Phreesia 12/29/2020   Anxiety    Phreesia 12/29/2020   History of miscarriage    Hyperlipidemia    Hypertension    Panic disorder    Past Surgical History:  Procedure Laterality Date   FRACTURE SURGERY N/A    Phreesia 12/29/2020   TUBAL LIGATION     Current Outpatient Medications on File Prior to Visit  Medication Sig Dispense Refill   Aspirin-Acetaminophen-Caffeine (GOODYS EXTRA STRENGTH) 500-325-65 MG PACK Take 1 packet by mouth 2 (two) times daily as needed (Pain).     atorvastatin (LIPITOR) 20 MG tablet TAKE 1 TABLET BY MOUTH EVERY DAY 30 tablet 5   Cholecalciferol (VITAMIN D)  2000 UNITS CAPS Take 1 capsule by mouth daily.     clonazePAM (KLONOPIN) 0.5 MG tablet Take 1 tablet (0.5 mg total) by mouth 3 (three) times daily as needed for anxiety. 90 tablet 0   levocetirizine (XYZAL) 5 MG tablet TAKE 1 TABLET BY MOUTH EVERY DAY IN THE EVENING 90 tablet 2   losartan (COZAAR) 50 MG tablet TAKE 1 TABLET BY MOUTH EVERY DAY 30 tablet 2   venlafaxine XR (EFFEXOR-XR) 150 MG 24 hr capsule TAKE 1 CAPSULE BY MOUTH DAILY WITH BREAKFAST. 90 capsule 0   No current facility-administered medications on file prior to visit.   Allergies  Allergen Reactions   Antihistamines, Chlorpheniramine-Type     Raised heart rate.   Social History   Socioeconomic History   Marital status: Married    Spouse name: Not on file   Number of children: Not on file   Years of education: Not on file   Highest education level: Not on file  Occupational History   Not on file  Tobacco Use   Smoking status: Every Day    Packs/day: 1    Types: Cigarettes   Smokeless tobacco: Never  Substance and Sexual Activity   Alcohol use: Yes   Drug use: No   Sexual  activity: Not on file  Other Topics Concern   Not on file  Social History Narrative   Not on file   Social Determinants of Health   Financial Resource Strain: Not on file  Food Insecurity: Not on file  Transportation Needs: Not on file  Physical Activity: Not on file  Stress: Not on file  Social Connections: Not on file  Intimate Partner Violence: Not on file      Review of Systems  All other systems reviewed and are negative.      Objective:   Physical Exam Vitals reviewed. Exam conducted with a chaperone present.  Constitutional:      General: She is not in acute distress.    Appearance: She is well-developed. She is not diaphoretic.  HENT:     Head: Normocephalic and atraumatic.     Right Ear: External ear normal.     Left Ear: External ear normal.     Nose: Nose normal.     Mouth/Throat:     Pharynx: No oropharyngeal  exudate.  Eyes:     General: No scleral icterus.       Right eye: No discharge.        Left eye: No discharge.     Conjunctiva/sclera: Conjunctivae normal.     Pupils: Pupils are equal, round, and reactive to light.  Neck:     Thyroid: No thyromegaly.     Vascular: No JVD.     Trachea: No tracheal deviation.  Cardiovascular:     Rate and Rhythm: Normal rate and regular rhythm.     Heart sounds: Normal heart sounds. No murmur heard.    No friction rub. No gallop.  Pulmonary:     Effort: Pulmonary effort is normal. No respiratory distress.     Breath sounds: Normal breath sounds. No stridor. No wheezing or rales.  Chest:     Chest wall: No tenderness.  Abdominal:     General: Bowel sounds are normal. There is no distension.     Palpations: Abdomen is soft. There is no mass.     Tenderness: There is no abdominal tenderness. There is no guarding or rebound.     Hernia: No hernia is present. There is no hernia in the left inguinal area or right inguinal area.  Genitourinary:    General: Normal vulva.     Labia:        Right: No rash or tenderness.        Left: No rash or tenderness.      Vagina: Normal. No vaginal discharge.     Cervix: No cervical motion tenderness, friability or erythema.     Uterus: Normal. Not enlarged and not tender.      Adnexa: Right adnexa normal and left adnexa normal.       Right: No mass or tenderness.         Left: No mass or tenderness.    Musculoskeletal:        General: No tenderness or deformity. Normal range of motion.     Cervical back: Normal range of motion and neck supple.  Lymphadenopathy:     Cervical: No cervical adenopathy.     Lower Body: No right inguinal adenopathy. No left inguinal adenopathy.  Skin:    General: Skin is warm.     Coloration: Skin is not pale.     Findings: No erythema or rash.  Neurological:     Mental Status: She is alert and oriented to person, place,  and time.     Cranial Nerves: No cranial nerve deficit.      Sensory: No sensory deficit.     Motor: No abnormal muscle tone.     Coordination: Coordination normal.     Deep Tendon Reflexes: Reflexes normal.  Psychiatric:        Attention and Perception: Attention and perception normal.        Speech: She is communicative. Speech is not rapid and pressured, slurred or tangential.        Behavior: Behavior normal.        Thought Content: Thought content normal.        Cognition and Memory: Cognition and memory normal.        Judgment: Judgment normal.           Assessment & Plan:  Pure hypercholesterolemia - Plan: CBC with Differential/Platelet, COMPLETE METABOLIC PANEL WITH GFR, Lipid panel  GAD (generalized anxiety disorder)  Benign essential HTN - Plan: CBC with Differential/Platelet, COMPLETE METABOLIC PANEL WITH GFR, Lipid panel  Cervical cancer screening - Plan: PAP, Thin Prep w/HPV rflx HPV Type 16/18  General medical exam - Plan: CBC with Differential/Platelet, COMPLETE METABOLIC PANEL WITH GFR, Lipid panel, PAP, Thin Prep w/HPV rflx HPV Type 16/18  Encounter for screening mammogram for malignant neoplasm of breast - Plan: CANCELED: MM Digital Screening  Colon cancer screening - Plan: Cologuard  Amenorrhea - Plan: FSH/LH, Prolactin  Right foot pain - Plan: DG Foot Complete Right Patient's pelvic exam today is completely normal.  Pap smear was sent to pathology in a labeled container.  Recommended a mammogram but patient prefers to schedule this herself.  Will schedule the patient for Cologuard as requested by the patient.  Continue to encourage her to get a colonoscopy.  Will check an FSH, and LH, and a prolactin level due to her amenorrhea.  Check CBC CMP and a fasting lipid panel although her blood pressure is outstanding.  Smoking cessation.  Begin with an x-ray of the right foot.  There is a painful spur forming off the first MTP joint.  Likely will need a podiatry referral for surgical treatment given the pain that the patient  agrees that await the results of the x-ray first.

## 2023-05-29 LAB — PAP, TP IMAGING W/ HPV RNA, RFLX HPV TYPE 16,18/45: HPV DNA High Risk: NOT DETECTED

## 2023-06-01 LAB — LIPID PANEL
Cholesterol: 183 mg/dL (ref <200)
HDL: 91 mg/dL (ref >=50)
LDL Cholesterol (Calc): 76 mg/dL (calc)
Non-HDL Cholesterol (Calc): 92 mg/dL (calc) (ref <130)
Total CHOL/HDL Ratio: 2 (calc) (ref <5.0)
Triglycerides: 76 mg/dL (ref <150)

## 2023-06-01 LAB — FSH/LH
FSH: 87.9 m[IU]/mL
LH: 58.9 m[IU]/mL

## 2023-06-01 LAB — CBC WITH DIFFERENTIAL/PLATELET
Absolute Monocytes: 400 cells/uL (ref 200–950)
Basophils Absolute: 32 cells/uL (ref 0–200)
Basophils Relative: 0.6 %
Eosinophils Absolute: 38 cells/uL (ref 15–500)
Eosinophils Relative: 0.7 %
HCT: 40.5 % (ref 35.0–45.0)
Hemoglobin: 13.9 g/dL (ref 11.7–15.5)
Lymphs Abs: 1253 cells/uL (ref 850–3900)
MCH: 33 pg (ref 27.0–33.0)
MCHC: 34.3 g/dL (ref 32.0–36.0)
MCV: 96.2 fL (ref 80.0–100.0)
MPV: 11.4 fL (ref 7.5–12.5)
Monocytes Relative: 7.4 %
Neutro Abs: 3677 cells/uL (ref 1500–7800)
Neutrophils Relative %: 68.1 %
Platelets: 222 10*3/uL (ref 140–400)
RBC: 4.21 10*6/uL (ref 3.80–5.10)
RDW: 12.9 % (ref 11.0–15.0)
Total Lymphocyte: 23.2 %
WBC: 5.4 10*3/uL (ref 3.8–10.8)

## 2023-06-01 LAB — COMPLETE METABOLIC PANEL WITH GFR
AG Ratio: 1.7 (calc) (ref 1.0–2.5)
ALT: 62 U/L — ABNORMAL HIGH (ref 6–29)
AST: 90 U/L — ABNORMAL HIGH (ref 10–35)
Albumin: 4.3 g/dL (ref 3.6–5.1)
Alkaline phosphatase (APISO): 58 U/L (ref 31–125)
BUN/Creatinine Ratio: 6 (calc) (ref 6–22)
BUN: 4 mg/dL — ABNORMAL LOW (ref 7–25)
CO2: 26 mmol/L (ref 20–32)
Calcium: 9.3 mg/dL (ref 8.6–10.2)
Chloride: 104 mmol/L (ref 98–110)
Creat: 0.65 mg/dL (ref 0.50–0.99)
Globulin: 2.5 g/dL (calc) (ref 1.9–3.7)
Glucose, Bld: 76 mg/dL (ref 65–99)
Potassium: 4.2 mmol/L (ref 3.5–5.3)
Sodium: 140 mmol/L (ref 135–146)
Total Bilirubin: 0.3 mg/dL (ref 0.2–1.2)
Total Protein: 6.8 g/dL (ref 6.1–8.1)
eGFR: 109 mL/min/{1.73_m2} (ref >=60)

## 2023-06-01 LAB — HEPATITIS PANEL, ACUTE
Hep A IgM: NONREACTIVE
Hep B C IgM: NONREACTIVE
Hepatitis B Surface Ag: NONREACTIVE
Hepatitis C Ab: NONREACTIVE

## 2023-06-01 LAB — TEST AUTHORIZATION

## 2023-06-01 LAB — PROLACTIN: Prolactin: 8.8 ng/mL

## 2023-06-04 ENCOUNTER — Encounter: Payer: Self-pay | Admitting: Family Medicine

## 2023-06-04 LAB — COLOGUARD

## 2023-06-07 ENCOUNTER — Other Ambulatory Visit: Payer: Self-pay | Admitting: Family Medicine

## 2023-06-07 DIAGNOSIS — F410 Panic disorder [episodic paroxysmal anxiety] without agoraphobia: Secondary | ICD-10-CM

## 2023-06-07 DIAGNOSIS — I10 Essential (primary) hypertension: Secondary | ICD-10-CM

## 2023-06-07 NOTE — Telephone Encounter (Signed)
Requested Prescriptions  Pending Prescriptions Disp Refills   losartan (COZAAR) 50 MG tablet [Pharmacy Med Name: LOSARTAN POTASSIUM 50 MG TAB] 90 tablet 1    Sig: TAKE 1 TABLET BY MOUTH EVERY DAY     Cardiovascular:  Angiotensin Receptor Blockers Failed - 06/07/2023  2:32 PM      Failed - Valid encounter within last 6 months    Recent Outpatient Visits           1 year ago Screening-pulmonary TB   [facility] Pickard, Priscille Heidelberg, MD   1 year ago Benign essential HTN   [facility] Tanya Nones, Priscille Heidelberg, MD   1 year ago Bacterial sinusitis   [facility] Valentino Nose, NP   1 year ago No-show for appointment   [facility] Valentino Nose, NP   2 years ago Viral upper respiratory tract infection   [facility] Tanya Nones, Priscille Heidelberg, MD              Passed - Cr in normal range and within 180 days    Creat  Date Value Ref Range Status  05/28/2023 0.65 0.50 - 0.99 mg/dL Final         Passed - K in normal range and within 180 days    Potassium  Date Value Ref Range Status  05/28/2023 4.2 3.5 - 5.3 mmol/L Final         Passed - Patient is not pregnant      Passed - Last BP in normal range    BP Readings from Last 1 Encounters:  05/28/23 122/82

## 2023-06-13 ENCOUNTER — Other Ambulatory Visit: Payer: Self-pay | Admitting: Family Medicine

## 2023-06-13 DIAGNOSIS — J40 Bronchitis, not specified as acute or chronic: Secondary | ICD-10-CM

## 2023-06-14 ENCOUNTER — Ambulatory Visit (HOSPITAL_COMMUNITY)
Admission: EM | Admit: 2023-06-14 | Discharge: 2023-06-14 | Disposition: A | Discharge status: Home / Self Care | Payer: 59 | Attending: Internal Medicine | Admitting: Internal Medicine

## 2023-06-14 ENCOUNTER — Encounter (HOSPITAL_COMMUNITY): Payer: Self-pay

## 2023-06-14 DIAGNOSIS — M5431 Sciatica, right side: Secondary | ICD-10-CM

## 2023-06-14 MED ORDER — NAPROXEN 375 MG PO TABS: 375.0000 mg | ORAL_TABLET | Freq: Two times a day (BID) | ORAL | 0 refills | Status: DC

## 2023-06-14 MED ORDER — DEXAMETHASONE SODIUM PHOSPHATE 10 MG/ML IJ SOLN
5.0000 mg | Freq: Once | INTRAMUSCULAR | Status: AC
  Administered 2023-06-14: 5 mg via INTRAMUSCULAR

## 2023-06-14 MED ORDER — DEXAMETHASONE SODIUM PHOSPHATE 10 MG/ML IJ SOLN
INTRAMUSCULAR | Status: AC
  Filled 2023-06-14: qty 1

## 2023-06-14 MED ORDER — METHOCARBAMOL 500 MG PO TABS: 500.0000 mg | ORAL_TABLET | Freq: Every evening | ORAL | 0 refills | Status: DC | PRN

## 2023-06-14 NOTE — Discharge Instructions (Addendum)
Please take medications as prescribed Gentle stretching exercises as the pain improves Please do not drive or operate heavy machinery after taking Robaxin because Robaxin will make you drowsy. Please use heating pad on a 20-minute on-20 minutes off cycle If you have worsening symptoms please return to urgent care to be reevaluated.

## 2023-06-14 NOTE — ED Triage Notes (Signed)
Pt c/o lower back pain radiating down buttocks/legs since Saturday after lifting a box. States taking ibuprofen q 6 hrs.

## 2023-06-15 ENCOUNTER — Telehealth (HOSPITAL_COMMUNITY): Payer: Self-pay

## 2023-06-15 NOTE — Telephone Encounter (Signed)
Patient calling back in  with questions about medication shot given last night during her visit. Patient states that the pain is better but has not gone away.   Patient informed that the medication may not rid her of the sciatica pain 100%. Informed to continue oral Naproxen and Methocarbamol as prescribed to aide in her recovery.   Patient verbalized understanding

## 2023-06-18 NOTE — ED Provider Notes (Signed)
[facility]    CSN: [id number] Arrival date & time: 06/14/23  1953      History   Chief Complaint Chief Complaint  Patient presents with   Back Pain    HPI Elaine Johns is a 47 y.o. female comes to the urgent care with 4-day history of lower back pain.  Pain started after she did some heavy lifting.  Pain radiates to the right leg.  No weakness, numbness or tingling of the right leg.  Pain is of moderate severity, sharp and aggravated by movement with no known relieving factors.  She has tried.  (I.e. Advil) with no improvement in her symptoms. HPI  Past Medical History:  Diagnosis Date   Abnormal Pap smear and cervical HPV (human papillomavirus)    Anemia    Phreesia 12/29/2020   Anxiety    Phreesia 12/29/2020   History of miscarriage    Hyperlipidemia    Hypertension    Panic disorder     Patient Active Problem List   Diagnosis Date Noted   Smoker 07/06/2020   Hypertensive disorder 07/06/2020   Hypercholesterolemia 07/06/2020   Anxiety 07/06/2020   Vitamin D deficiency 09/24/2013   Panic disorder     Past Surgical History:  Procedure Laterality Date   FRACTURE SURGERY N/A    Phreesia 12/29/2020   TUBAL LIGATION      OB History   No obstetric history on file.      Home Medications    Prior to Admission medications   Medication Sig Start Date End Date Taking? Authorizing Provider  methocarbamol (ROBAXIN) 500 MG tablet Take 1 tablet (500 mg total) by mouth at bedtime as needed for muscle spasms. 06/14/23  Yes Williette Loewe, Britta Mccreedy, MD  naproxen (NAPROSYN) 375 MG tablet Take 1 tablet (375 mg total) by mouth 2 (two) times daily. 06/14/23  Yes Suhaib Guzzo, Britta Mccreedy, MD  Aspirin-Acetaminophen-Caffeine (GOODYS EXTRA STRENGTH) [phone] MG PACK Take 1 packet by mouth 2 (two) times daily as needed (Pain).    [provider]  atorvastatin (LIPITOR) 20 MG tablet TAKE 1 TABLET BY MOUTH EVERY DAY 12/04/22   Donita Brooks, MD  Cholecalciferol  (VITAMIN D) 2000 UNITS CAPS Take 1 capsule by mouth daily.    [provider]  clonazePAM (KLONOPIN) 0.5 MG tablet TAKE 1 TABLET (0.5 MG TOTAL) BY MOUTH 3 (THREE) TIMES DAILY AS NEEDED FOR ANXIETY. 06/14/23   Donita Brooks, MD  levocetirizine (XYZAL) 5 MG tablet TAKE 1 TABLET BY MOUTH EVERY DAY IN THE EVENING Patient not taking: Reported on 05/28/2023 04/17/23   Donita Brooks, MD  losartan (COZAAR) 50 MG tablet TAKE 1 TABLET BY MOUTH EVERY DAY 06/07/23   Donita Brooks, MD  venlafaxine XR (EFFEXOR-XR) 150 MG 24 hr capsule TAKE 1 CAPSULE BY MOUTH DAILY WITH BREAKFAST. 05/16/23   Donita Brooks, MD    Family History History reviewed. No pertinent family history.  Social History Social History   Tobacco Use   Smoking status: Every Day    Packs/day: 1    Types: Cigarettes   Smokeless tobacco: Never  Substance Use Topics   Alcohol use: Yes   Drug use: No     Allergies   Antihistamines, chlorpheniramine-type   Review of Systems Review of Systems As per HPI  Physical Exam Triage Vital Signs ED Triage Vitals  Enc Vitals Group     BP 06/14/23 2003 (!) 134/90     Pulse Rate 06/14/23 2003 81  Resp 06/14/23 2003 18     Temp 06/14/23 2003 97.8 F (36.6 C)     Temp src --      SpO2 06/14/23 2003 96 %     Weight --      Height --      Head Circumference --      Peak Flow --      Pain Score 06/14/23 2004 8     Pain Loc --      Pain Edu? --      Excl. in GC? --    No data found.  Updated Vital Signs BP (!) 134/90 (BP Location: Left Arm)   Pulse 81   Temp 97.8 F (36.6 C)   Resp 18   SpO2 96%   Visual Acuity Right Eye Distance:   Left Eye Distance:   Bilateral Distance:    Right Eye Near:   Left Eye Near:    Bilateral Near:     Physical Exam Vitals and nursing note reviewed.  Constitutional:      General: She is not in acute distress.    Appearance: She is not ill-appearing.  Cardiovascular:     Rate and Rhythm: Normal rate and regular  rhythm.     Pulses: Normal pulses.     Heart sounds: Normal heart sounds.  Pulmonary:     Effort: Pulmonary effort is normal.     Breath sounds: Normal breath sounds.  Abdominal:     General: Bowel sounds are normal.     Palpations: Abdomen is soft.  Musculoskeletal:        General: No swelling or tenderness. Normal range of motion.  Skin:    General: Skin is warm.     Capillary Refill: Capillary refill takes less than 2 seconds.     Coloration: Skin is not jaundiced or pale.  Neurological:     General: No focal deficit present.     Mental Status: She is alert and oriented to person, place, and time.      UC Treatments / Results  Labs (all labs ordered are listed, but only abnormal results are displayed) Labs Reviewed - No data to display  EKG   Radiology No results found.  Procedures Procedures (including critical care time)  Medications Ordered in UC Medications  dexamethasone (DECADRON) injection 5 mg (5 mg Intramuscular Given 06/14/23 2019)    Initial Impression / Assessment and Plan / UC Course  I have reviewed the triage vital signs and the nursing notes.  Pertinent labs & imaging results that were available during my care of the patient were reviewed by me and considered in my medical decision making (see chart for details).     1.  Right-sided sciatica: Dexamethasone IM x 1 dose Robaxin at bedtime as needed for muscle stiffness-medication precautions given Naproxen 375 mg orally twice daily as needed for pain Return precautions given No indication for imaging Gentle stretching exercises as the pain improves. Final Clinical Impressions(s) / UC Diagnoses   Final diagnoses:  Right sided sciatica     Discharge Instructions      Please take medications as prescribed Gentle stretching exercises as the pain improves Please do not drive or operate heavy machinery after taking Robaxin because Robaxin will make you drowsy. Please use heating pad on a  20-minute on-20 minutes off cycle If you have worsening symptoms please return to urgent care to be reevaluated.   ED Prescriptions     Medication Sig Dispense Auth. Provider  naproxen (NAPROSYN) 375 MG tablet Take 1 tablet (375 mg total) by mouth 2 (two) times daily. 20 tablet Teancum Brule, Britta Mccreedy, MD   methocarbamol (ROBAXIN) 500 MG tablet Take 1 tablet (500 mg total) by mouth at bedtime as needed for muscle spasms. 10 tablet Jermany Rimel, Britta Mccreedy, MD      PDMP not reviewed this encounter.   Merrilee Jansky, MD 06/18/23 1345

## 2023-06-20 ENCOUNTER — Encounter (HOSPITAL_COMMUNITY): Payer: Self-pay | Admitting: Emergency Medicine

## 2023-06-20 ENCOUNTER — Ambulatory Visit (HOSPITAL_COMMUNITY)
Admission: EM | Admit: 2023-06-20 | Discharge: 2023-06-20 | Disposition: A | Discharge status: Home / Self Care | Payer: 59 | Attending: Family Medicine | Admitting: Family Medicine

## 2023-06-20 DIAGNOSIS — M5441 Lumbago with sciatica, right side: Secondary | ICD-10-CM

## 2023-06-20 MED ORDER — OXYCODONE HCL 5 MG PO TABS: 5.0000 mg | ORAL_TABLET | Freq: Four times a day (QID) | ORAL | 0 refills | Status: DC | PRN

## 2023-06-20 NOTE — ED Provider Notes (Signed)
[facility]   [id number] 06/20/23 Arrival Time: 1327  ASSESSMENT & PLAN:  1. Acute right-sided low back pain with right-sided sciatica    Without LE neurological changes. Able to ambulate here and hemodynamically stable. Question disc issue. May eventually need MRI. No red flag symptoms/findings today. Has appt with PCP this coming Monday. Cannot take Tylenol secondary to reported elevation of LFTs.  As needed: Meds ordered this encounter  Medications   oxyCODONE (OXY IR/ROXICODONE) 5 MG immediate release tablet    Sig: Take 1 tablet (5 mg total) by mouth every 6 (six) hours as needed for severe pain.    Dispense:  20 tablet    Refill:  0   Work/school excuse note: provided. Medication sedation precautions given. Encourage ROM/movement as tolerated.  Recommend:  Follow-up Information     Donita Brooks, MD.   Specialty: Family Medicine Why: Keep your appointment on Monday. Contact information: [address] [place] [address] [phone]         [facility] Emergency Department at [facility].   Specialty: Emergency Medicine Why: If symptoms worsen in any way. Contact information: [address] [id number] mc [place] [address] [phone]                Reviewed expectations re: course of current medical issues. Questions answered. Outlined signs and symptoms indicating need for more acute intervention. Patient verbalized understanding. After Visit Summary given.   SUBJECTIVE: History from: patient.  Elaine Johns is a 47 y.o. female who was seen here on 06/14/23; note reviewed; IM Decadron + Robaxin. She feels like she is hurting more now; R lower back/buttock pain that shoots down posterior RLE. Denies bowel/bladder habit changes. Pain interferes with sleep. Ambulatory but with reported pain. Can get into a seated position that is fairly comfortable. Denies back trauma. Had to leave work  early.   OBJECTIVE:  Vitals:   06/20/23 1441  BP: 139/83  Pulse: 79  Resp: 16  Temp: 98.4 F (36.9 C)  TempSrc: Oral  SpO2: 98%    General appearance: alert; no distress but appears uncomfortable HEENT: Twining; AT Neck: supple with FROM; without midline tenderness CV: regular Lungs: unlabored respirations; speaks full sentences without difficulty Abdomen: soft, non-tender; non-distended Back: moderate and poorly localized tenderness to palpation over R lower mid to lateral back that extends into L buttock ; FROM at waist; bruising: none; without midline tenderness Extremities: generalized pain with RLE examination; without edema; symmetrical without gross deformities; normal ROM of RLE Skin: warm and dry Neurologic: normal gait; normal sensation and strength of RLE Psychological: alert and cooperative; normal mood and affect   Allergies  Allergen Reactions   Antihistamines, Chlorpheniramine-Type     Raised heart rate.    Past Medical History:  Diagnosis Date   Abnormal Pap smear and cervical HPV (human papillomavirus)    Anemia    Phreesia 12/29/2020   Anxiety    Phreesia 12/29/2020   History of miscarriage    Hyperlipidemia    Hypertension    Panic disorder    Social History   Socioeconomic History   Marital status: Married    Spouse name: Not on file   Number of children: Not on file   Years of education: Not on file   Highest education level: Not on file  Occupational History   Not on file  Tobacco Use   Smoking status: Every Day    Packs/day: 1  Types: Cigarettes   Smokeless tobacco: Never  Substance and Sexual Activity   Alcohol use: Yes   Drug use: No   Sexual activity: Not on file  Other Topics Concern   Not on file  Social History Narrative   Not on file   Social Determinants of Health   Financial Resource Strain: Not on file  Food Insecurity: Not on file  Transportation Needs: Not on file  Physical Activity: Not on file  Stress: Not  on file  Social Connections: Not on file  Intimate Partner Violence: Not on file   No family history on file. Past Surgical History:  Procedure Laterality Date   FRACTURE SURGERY N/A    Phreesia 12/29/2020   TUBAL LIGATION        Mardella Layman, MD 06/20/23 [phone]

## 2023-06-20 NOTE — Discharge Instructions (Signed)
Be aware, you have been prescribed pain medications that may cause drowsiness. While taking this medication, do not take any other medications containing acetaminophen (Tylenol). Do not combine with alcohol or recreational drugs. Please do not drive, operate heavy machinery, or take part in activities that require making important decisions while on this medication as your judgement may be clouded.  

## 2023-06-20 NOTE — ED Triage Notes (Signed)
Pt reports she was seen here last Thursday and Dx with sciatica. Told to come back if treatment didn't help and unable to get into PCP. Pt reports had to leave work today due to pain and numbness in back and right leg.  Not taking the Naproxen or muscle relaxer prescribed last week since not helping.   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

## 2023-06-24 ENCOUNTER — Other Ambulatory Visit: Payer: Self-pay

## 2023-06-24 ENCOUNTER — Encounter (HOSPITAL_COMMUNITY): Payer: Self-pay

## 2023-06-24 ENCOUNTER — Emergency Department (HOSPITAL_COMMUNITY)
Admission: EM | Admit: 2023-06-24 | Discharge: 2023-06-24 | Disposition: A | Discharge status: Home / Self Care | Payer: 59 | Attending: Emergency Medicine | Admitting: Emergency Medicine

## 2023-06-24 DIAGNOSIS — Z7982 Long term (current) use of aspirin: Secondary | ICD-10-CM | POA: Diagnosis not present

## 2023-06-24 DIAGNOSIS — I10 Essential (primary) hypertension: Secondary | ICD-10-CM | POA: Diagnosis not present

## 2023-06-24 DIAGNOSIS — M545 Low back pain, unspecified: Secondary | ICD-10-CM | POA: Diagnosis not present

## 2023-06-24 DIAGNOSIS — Z79899 Other long term (current) drug therapy: Secondary | ICD-10-CM | POA: Insufficient documentation

## 2023-06-24 DIAGNOSIS — F17200 Nicotine dependence, unspecified, uncomplicated: Secondary | ICD-10-CM | POA: Diagnosis not present

## 2023-06-24 DIAGNOSIS — M5416 Radiculopathy, lumbar region: Secondary | ICD-10-CM

## 2023-06-24 MED ORDER — PREDNISONE 20 MG PO TABS: ORAL_TABLET | ORAL | 0 refills | Status: DC

## 2023-06-24 MED ORDER — IBUPROFEN 600 MG PO TABS: 600.0000 mg | ORAL_TABLET | Freq: Four times a day (QID) | ORAL | 0 refills | Status: DC | PRN

## 2023-06-24 MED ORDER — KETOROLAC TROMETHAMINE 15 MG/ML IJ SOLN
15.0000 mg | Freq: Once | INTRAMUSCULAR | Status: AC
  Administered 2023-06-24: 15 mg via INTRAMUSCULAR
  Filled 2023-06-24: qty 1

## 2023-06-24 NOTE — Discharge Instructions (Addendum)
Your back pain is likely due to a muscle strain versus a small pinched nerve.  You may take medication prescribed.  Follow-up with your primary care doctor for further care.  Return if any concern.

## 2023-06-24 NOTE — ED Triage Notes (Signed)
Complaining of lower back pain that Is radiating down the rt hip. Was seen by urgent care and treated for sciatica which helped for a little while but now is hurting badly.  Pain has been going on for 2.5 weeks.  Was told she may need a MRI.

## 2023-06-24 NOTE — ED Provider Notes (Signed)
[place] EMERGENCY DEPARTMENT AT [facility] Provider Note   CSN: [id number] Arrival date & time: 06/24/23  1905     History  Chief Complaint  Patient presents with   Back Pain    Elaine Johns is a 47 y.o. female.  The history is provided by the patient and medical records. No language interpreter was used.  Back Pain    48 year old female significant history of hypertension, hyperlipidemia, anemia, anxiety, panic disorder, presenting complaining of lower back pain. The back pain started approx 2.5 weeks ago and radiates down her right leg. 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 She has never had back pain before. Since the back pain started, she has visited an urgent care twice, most recently on Wed (4 days ago) where she was diagnosed with sciatica, prescribed Oxycodone and told to follow up with her PCP and report to the ER if her symptoms worsened in the mean time. She has an appointment at her PCP on Monday, took her last oxycodone at 1:30 and reports to the ED because her pain has worsened. She has not had an xray on her back and would like to have an MRI. She denies bowel or bladder changes. She is a vaper, and denies drug use. No hx of IVDU or active cancer.   Home Medications Prior to Admission medications   Medication Sig Start Date End Date Taking? Authorizing Provider  Aspirin-Acetaminophen-Caffeine (GOODYS EXTRA STRENGTH) [phone] MG PACK Take 1 packet by mouth 2 (two) times daily as needed (Pain).    [provider]  atorvastatin (LIPITOR) 20 MG tablet TAKE 1 TABLET BY MOUTH EVERY DAY 12/04/22   Donita Brooks, MD  Cholecalciferol (VITAMIN D) 2000 UNITS CAPS Take 1 capsule by mouth daily.    [provider]  clonazePAM (KLONOPIN) 0.5 MG tablet TAKE 1 TABLET (0.5 MG TOTAL) BY MOUTH 3 (THREE) TIMES DAILY AS NEEDED FOR ANXIETY. 06/14/23   Donita Brooks, MD   levocetirizine (XYZAL) 5 MG tablet TAKE 1 TABLET BY MOUTH EVERY DAY IN THE EVENING Patient not taking: Reported on 05/28/2023 04/17/23   Donita Brooks, MD  losartan (COZAAR) 50 MG tablet TAKE 1 TABLET BY MOUTH EVERY DAY 06/07/23   Donita Brooks, MD  methocarbamol (ROBAXIN) 500 MG tablet Take 1 tablet (500 mg total) by mouth at bedtime as needed for muscle spasms. 06/14/23   Merrilee Jansky, MD  naproxen (NAPROSYN) 375 MG tablet Take 1 tablet (375 mg total) by mouth 2 (two) times daily. 06/14/23   Lamptey, Britta Mccreedy, MD  oxyCODONE (OXY IR/ROXICODONE) 5 MG immediate release tablet Take 1 tablet (5 mg total) by mouth every 6 (six) hours as needed for severe pain. 06/20/23   Mardella Layman, MD  venlafaxine XR (EFFEXOR-XR) 150 MG 24 hr capsule TAKE 1 CAPSULE BY MOUTH DAILY WITH BREAKFAST. 05/16/23   Donita Brooks, MD      Allergies    Antihistamines, chlorpheniramine-type    Review of Systems   Review of Systems  Musculoskeletal:  Positive for back pain.  All other systems reviewed and are negative.   Physical Exam Updated Vital Signs BP 130/82 (BP Location: Right Arm)   Pulse 75   Temp 98.9 F (37.2 C) (Oral)   Resp 18   Ht 4\' 11"  (1.499 m)   Wt 49 kg   SpO2 100%   BMI 21.81 kg/m  Physical  Exam Vitals and nursing note reviewed.  Constitutional:      General: She is not in acute distress.    Appearance: She is well-developed.  HENT:     Head: Atraumatic.  Eyes:     Conjunctiva/sclera: Conjunctivae normal.  Pulmonary:     Effort: Pulmonary effort is normal.  Abdominal:     Palpations: Abdomen is soft.     Tenderness: There is no abdominal tenderness.  Musculoskeletal:     Cervical back: Neck supple.     Comments: Tenderness noted to right lumbosacral region with negative straight leg raise.  No significant midline spine tenderness, no overlying skin changes.  Skin:    Capillary Refill: Capillary refill takes less than 2 seconds.     Findings: No rash.  Neurological:      Mental Status: She is alert.     Comments: Intact patellar DTR bilaterally without foot drops.    Psychiatric:        Mood and Affect: Mood normal.     ED Results / Procedures / Treatments   Labs (all labs ordered are listed, but only abnormal results are displayed) Labs Reviewed - No data to display  EKG None  Radiology No results found.  Procedures Procedures    Medications Ordered in ED Medications  ketorolac (TORADOL) 15 MG/ML injection 15 mg (15 mg Intramuscular Given 06/24/23 2235)    ED Course/ Medical Decision Making/ A&P                             Medical Decision Making Risk Prescription drug management.   BP 130/82 (BP Location: Right Arm)   Pulse 75   Temp 98.9 F (37.2 C) (Oral)   Resp 18   Ht 4\' 11"  (1.499 m)   Wt 49 kg   SpO2 100%   BMI 21.81 kg/m   2:9 PM 47 year old female significant history of hypertension, hyperlipidemia, anemia, anxiety, panic disorder, presenting complaining of lower back pain. The back pain started approx 2.5 weeks ago and radiates down her right leg. 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 She has never had back pain before. Since the back pain started, she has visited an urgent care twice, most recently on Wed (4 days ago) where she was diagnosed with sciatica, prescribed Oxycodone and told to follow up with her PCP and report to the ER if her symptoms worsened in the mean time. She has an appointment at her PCP on Monday, took her last oxycodone at 1:30 and reports to the ED because her pain has worsened. She has not had an xray on her back and would like to have an MRI. She denies bowel or bladder changes. She is a vaper, and denies drug use. No hx of IVDU or active cancer.   On exam, patient is laying in a left lateral recumbent position appears in mild discomfort.  She does not have any significant midline spine tenderness but she does have tenderness  along her right lumbosacral region with negative straight leg raise.  She is neurovascular intact and able to ambulate.  She has intact distal pedal pulses.  DDx: Lumbosacral strain, sciatica, lumbar radiculopathy, cauda equina, discitis, osteomyelitis, fracture, dislocation  Patient without any red flags, she is able to ambulate, she does not have any symptoms concerning for cauda equina, vital signs overall reassuring no fever no hypoxia.  Since patient has been taking opiate pain medication and muscle relaxant without adequate relief, I felt patient may benefit from a short course of prednisone as well as anti-inflammatory medication.  She will follow-up with her PCP tomorrow.  She does not need an emergent MRI at this time.  I also have low suspicion for fracture or dislocation as patient denies any specific trauma to the affected area.  Improvement of symptoms with Toradol, patient stable to be discharged home.  Social determinant health including tobacco use.        Final Clinical Impression(s) / ED Diagnoses Final diagnoses:  Lumbar radiculopathy    Rx / DC Orders ED Discharge Orders          Ordered    predniSONE (DELTASONE) 20 MG tablet        06/24/23 2231    ibuprofen (ADVIL) 600 MG tablet  Every 6 hours PRN        06/24/23 2231              Fayrene Helper, PA-C 06/24/23 2259    Glyn Ade, MD 06/24/23 2311

## 2023-06-25 ENCOUNTER — Encounter: Payer: Self-pay | Admitting: Family Medicine

## 2023-06-25 ENCOUNTER — Ambulatory Visit (INDEPENDENT_AMBULATORY_CARE_PROVIDER_SITE_OTHER): Payer: 59 | Admitting: Family Medicine

## 2023-06-25 ENCOUNTER — Telehealth: Payer: Self-pay | Admitting: Family Medicine

## 2023-06-25 VITALS — BP 124/62 | HR 96 | Temp 98.4°F | Ht 59.0 in | Wt 107.8 lb

## 2023-06-25 DIAGNOSIS — M5431 Sciatica, right side: Secondary | ICD-10-CM

## 2023-06-25 MED ORDER — OXYCODONE-ACETAMINOPHEN 7.5-325 MG PO TABS: 1.0000 | ORAL_TABLET | ORAL | 0 refills | Status: DC | PRN

## 2023-06-25 NOTE — Telephone Encounter (Signed)
Patient called states she needs a PA for the oxycodone would like to know if there is an alternative that can be called in.

## 2023-06-25 NOTE — Progress Notes (Signed)
Subjective:    Patient ID: Elaine Johns, female    DOB: 1976/09/13, 47 y.o.   MRN: [id number]  Patient reports severe back pain for 2 weeks.  Patient denies any fall or injury that she is aware of.  However the pain is intense.  It radiates into her right gluteus and down the posterior aspect of her right leg into her posterior right knee and often all the way down into her right foot.  She denies any bowel or bladder incontinence.  She denies any saddle anesthesia.  She denies any leg weakness.  She denies any fevers or chills.  She has been to the urgent care once in the emergency room once due to the severity of the pain.  She has been given oxycodone and naproxen but these did not provide sufficient relief Past Medical History:  Diagnosis Date   Abnormal Pap smear and cervical HPV (human papillomavirus)    Anemia    Phreesia 12/29/2020   Anxiety    [employer]reesia 12/29/2020   History of miscarriage    Hyperlipidemia    Hypertension    Panic disorder    Past Surgical History:  Procedure Laterality Date   FRACTURE SURGERY N/A    [employer]reesia 12/29/2020   TUBAL LIGATION     Current Outpatient Medications on File Prior to Visit  Medication Sig Dispense Refill   Aspirin-Acetaminophen-Caffeine (GOODYS EXTRA STRENGTH) 500-325-65 MG PACK Take 1 packet by mouth 2 (two) times daily as needed (Pain).     atorvastatin (LIPITOR) 20 MG tablet TAKE 1 TABLET BY MOUTH EVERY DAY 30 tablet 5   Cholecalciferol (VITAMIN D) 2000 UNITS CAPS Take 1 capsule by mouth daily.     clonazePAM (KLONOPIN) 0.5 MG tablet TAKE 1 TABLET (0.5 MG TOTAL) BY MOUTH 3 (THREE) TIMES DAILY AS NEEDED FOR ANXIETY. 90 tablet 0   ibuprofen (ADVIL) 600 MG tablet Take 1 tablet (600 mg total) by mouth every 6 (six) hours as needed. 30 tablet 0   losartan (COZAAR) 50 MG tablet TAKE 1 TABLET BY MOUTH EVERY DAY 90 tablet 1   methocarbamol (ROBAXIN) 500 MG tablet Take 1 tablet (500 mg total) by mouth at bedtime as needed for muscle spasms.  10 tablet 0   naproxen (NAPROSYN) 375 MG tablet Take 1 tablet (375 mg total) by mouth 2 (two) times daily. 20 tablet 0   oxyCODONE (OXY IR/ROXICODONE) 5 MG immediate release tablet Take 1 tablet (5 mg total) by mouth every 6 (six) hours as needed for severe pain. 20 tablet 0   predniSONE (DELTASONE) 20 MG tablet 3 tabs po day one, then 2 tabs daily x 4 days 11 tablet 0   venlafaxine XR (EFFEXOR-XR) 150 MG 24 hr capsule TAKE 1 CAPSULE BY MOUTH DAILY WITH BREAKFAST. 90 capsule 0   levocetirizine (XYZAL) 5 MG tablet TAKE 1 TABLET BY MOUTH EVERY DAY IN THE EVENING (Patient not taking: Reported on 05/28/2023) 90 tablet 2   No current facility-administered medications on file prior to visit.   Allergies  Allergen Reactions   Antihistamines, Chlorpheniramine-Type     Raised heart rate.   Social History   Socioeconomic History   Marital status: Married    Spouse name: Not on file   Number of children: Not on file   Years of education: Not on file   Highest education level: Not on file  Occupational History   Not on file  Tobacco Use   Smoking status: Every Day    Packs/day: 1  Types: Cigarettes   Smokeless tobacco: Never  Substance and Sexual Activity   Alcohol use: Yes   Drug use: No   Sexual activity: Not on file  Other Topics Concern   Not on file  Social History Narrative   Not on file   Social Determinants of Health   Financial Resource Strain: Not on file  Food Insecurity: Not on file  Transportation Needs: Not on file  Physical Activity: Not on file  Stress: Not on file  Social Connections: Not on file  Intimate Partner Violence: Not on file      Review of Systems  All other systems reviewed and are negative.      Objective:   Physical Exam Vitals reviewed.  Constitutional:      General: She is not in acute distress.    Appearance: She is well-developed. She is not diaphoretic.  HENT:     Head: Normocephalic and atraumatic.  Neck:     Thyroid: No  thyromegaly.     Vascular: No JVD.     Trachea: No tracheal deviation.  Cardiovascular:     Rate and Rhythm: Normal rate and regular rhythm.     Heart sounds: Normal heart sounds. No murmur heard.    No friction rub. No gallop.  Pulmonary:     Effort: Pulmonary effort is normal. No respiratory distress.     Breath sounds: Normal breath sounds. No stridor. No wheezing or rales.  Chest:     Chest wall: No tenderness.  Genitourinary:    Vagina: Normal.     Cervix: No cervical motion tenderness, friability or erythema.     Uterus: Normal. Not enlarged and not tender.      Adnexa: Right adnexa normal and left adnexa normal.       Right: No mass or tenderness.         Left: No mass or tenderness.    Musculoskeletal:        General: Tenderness present.     Cervical back: Normal range of motion and neck supple.       Legs:  Lymphadenopathy:     Cervical: No cervical adenopathy.  Skin:    General: Skin is warm.     Coloration: Skin is not pale.     Findings: No erythema or rash.  Neurological:     Mental Status: She is alert and oriented to person, place, and time.     Cranial Nerves: No cranial nerve deficit.     Sensory: No sensory deficit.     Motor: No abnormal muscle tone.     Coordination: Coordination normal.     Deep Tendon Reflexes: Reflexes normal.  Psychiatric:        Attention and Perception: Attention and perception normal.        Speech: She is communicative. Speech is not rapid and pressured, slurred or tangential.        Behavior: Behavior normal.        Thought Content: Thought content normal.        Cognition and Memory: Cognition and memory normal.        Judgment: Judgment normal.           Assessment & Plan:  Right sided sciatica - Plan: DG Lumbar Spine Complete Symptoms sound consistent with right-sided sciatica.  Obtain x-ray of the lumbar spine.  Begin prednisone scratch her emergency room last month.  I refill patient's facet and get 30 tablets.  I  recommended that she rest  and stay out of work for 1 week.  She needs to avoid any lifting or bending or squatting.  But I did recommend gentle range of motion exercises.

## 2023-06-26 ENCOUNTER — Ambulatory Visit
Admission: RE | Admit: 2023-06-26 | Discharge: 2023-06-26 | Disposition: A | Discharge status: Home / Self Care | Payer: 59 | Source: Ambulatory Visit | Attending: Family Medicine | Admitting: Family Medicine

## 2023-06-26 ENCOUNTER — Other Ambulatory Visit: Payer: Self-pay | Admitting: Family Medicine

## 2023-06-26 DIAGNOSIS — M5431 Sciatica, right side: Secondary | ICD-10-CM

## 2023-06-26 MED ORDER — HYDROCODONE-ACETAMINOPHEN 5-325 MG PO TABS: 1.0000 | ORAL_TABLET | Freq: Four times a day (QID) | ORAL | 0 refills | Status: DC | PRN

## 2023-07-03 ENCOUNTER — Other Ambulatory Visit: Payer: Self-pay | Admitting: Family Medicine

## 2023-07-03 MED ORDER — PREDNISONE 20 MG PO TABS: ORAL_TABLET | ORAL | 0 refills | Status: DC

## 2023-07-15 ENCOUNTER — Other Ambulatory Visit: Payer: Self-pay | Admitting: Family Medicine

## 2023-07-15 DIAGNOSIS — J40 Bronchitis, not specified as acute or chronic: Secondary | ICD-10-CM

## 2023-07-26 ENCOUNTER — Telehealth: Payer: Self-pay | Admitting: Family Medicine

## 2023-07-26 ENCOUNTER — Other Ambulatory Visit: Payer: Self-pay | Admitting: Family Medicine

## 2023-07-26 ENCOUNTER — Encounter: Payer: Self-pay | Admitting: Family Medicine

## 2023-07-26 MED ORDER — NIRMATRELVIR/RITONAVIR (PAXLOVID)TABLET: 3.0000 | ORAL_TABLET | Freq: Two times a day (BID) | ORAL | 0 refills | Status: AC

## 2023-07-26 NOTE — Telephone Encounter (Signed)
Ok to send sick note via MyChart. Please see previous note in thread for Pharmacy info (requesting PAXLOVID).

## 2023-07-26 NOTE — Telephone Encounter (Signed)
Patient called to report positive COVID test result received about 15 minutes ago.   Sx: coughing, congestion, digestive issues, severe headache, body aches, fever of 100.4, nausea, vomiting  Patient requesting for provider to call in PAXLOVID to pharmacy.  Also requesting sick note for work  Pharmacy info:  [id number] [place], [place] - [address] [place] [address] Phone: [phone]  Fax: [phone] DEA #: [id number]    [phone].

## 2023-07-30 ENCOUNTER — Telehealth: Payer: Self-pay

## 2023-07-30 NOTE — Telephone Encounter (Signed)
Pt called in this morning to ask pcp to update letter for pt to return to work. Pt states that current note from pcp states that she would be returning to work today 07/30/23, but pt states that she is still testing positive for Covid, and does not feel any better. Please advise.  Cb#: [phone]

## 2023-08-11 ENCOUNTER — Other Ambulatory Visit: Payer: Self-pay | Admitting: Family Medicine

## 2023-08-13 NOTE — Telephone Encounter (Signed)
Requested Prescriptions  Pending Prescriptions Disp Refills   venlafaxine XR (EFFEXOR-XR) 150 MG 24 hr capsule [Pharmacy Med Name: VENLAFAXINE HCL ER 150 MG CAP] 90 capsule 0    Sig: TAKE 1 CAPSULE BY MOUTH DAILY WITH BREAKFAST.     Psychiatry: Antidepressants - SNRI - desvenlafaxine & venlafaxine Failed - 08/11/2023  9:05 AM      Failed - Valid encounter within last 6 months    Recent Outpatient Visits           1 year ago Screening-pulmonary TB   [facility] Pickard, Priscille Heidelberg, MD   1 year ago Benign essential HTN   [facility] Tanya Nones, Priscille Heidelberg, MD   1 year ago Bacterial sinusitis   [facility] Valentino Nose, NP   1 year ago No-show for appointment   [facility] Valentino Nose, NP   2 years ago Viral upper respiratory tract infection   [facility] Donita Brooks, MD              Failed - Lipid Panel in normal range within the last 12 months    Cholesterol  Date Value Ref Range Status  05/28/2023 183 <200 mg/dL Final   LDL Cholesterol (Calc)  Date Value Ref Range Status  05/28/2023 76 mg/dL (calc) Final    Comment:    Reference range: <100 . Desirable range <100 mg/dL for primary prevention;   <70 mg/dL for patients with CHD or diabetic patients  with > or = 2 CHD risk factors. Marland Kitchen LDL-C is now calculated using the Martin-Hopkins  calculation, which is a validated novel method providing  better accuracy than the Friedewald equation in the  estimation of LDL-C.  Horald Pollen et al. Lenox Ahr. 1610;960(45): 2061-2068  (http://education.[employer].com/faq/FAQ164)    HDL  Date Value Ref Range Status  05/28/2023 91 > OR = 50 mg/dL Final   Triglycerides  Date Value Ref Range Status  05/28/2023 76 <150 mg/dL Final         Passed - Cr in normal range and within 360 days    Creat  Date Value Ref Range Status  05/28/2023 0.65 0.50 - 0.99 mg/dL Final         Passed -  Last BP in normal range    BP Readings from Last 1 Encounters:  06/25/23 124/62

## 2023-08-14 ENCOUNTER — Other Ambulatory Visit: Payer: Self-pay | Admitting: Family Medicine

## 2023-08-14 DIAGNOSIS — J40 Bronchitis, not specified as acute or chronic: Secondary | ICD-10-CM

## 2023-08-15 NOTE — Telephone Encounter (Signed)
Requested medication (s) are due for refill today:   Provider to review  Requested medication (s) are on the active medication list:   Yes  Future visit scheduled:   No   LOV 06/25/2023   Last ordered: 07/16/2023 #90, 0 refills  Returned because it's a non delegated refill   Requested Prescriptions  Pending Prescriptions Disp Refills   clonazePAM (KLONOPIN) 0.5 MG tablet [Pharmacy Med Name: CLONAZEPAM 0.5 MG TABLET] 90 tablet 0    Sig: TAKE 1 TABLET (0.5 MG TOTAL) BY MOUTH 3 (THREE) TIMES DAILY AS NEEDED FOR ANXIETY.     Not Delegated - Psychiatry: Anxiolytics/Hypnotics 2 Failed - 08/14/2023  9:03 AM      Failed - This refill cannot be delegated      Failed - Urine Drug Screen completed in last 360 days      Failed - Valid encounter within last 6 months    Recent Outpatient Visits           1 year ago Screening-pulmonary TB   [facility] Pickard, Priscille Heidelberg, MD   1 year ago Benign essential HTN   [facility] Donita Brooks, MD   1 year ago Bacterial sinusitis   [facility] Valentino Nose, NP   1 year ago No-show for appointment   [facility] Valentino Nose, NP   2 years ago Viral upper respiratory tract infection   [facility] Pickard, Priscille Heidelberg, MD              Passed - Patient is not pregnant

## 2023-08-16 ENCOUNTER — Telehealth: Payer: Self-pay

## 2023-08-16 ENCOUNTER — Other Ambulatory Visit: Payer: Self-pay | Admitting: Family Medicine

## 2023-08-16 DIAGNOSIS — J40 Bronchitis, not specified as acute or chronic: Secondary | ICD-10-CM

## 2023-08-16 MED ORDER — CLONAZEPAM 0.5 MG PO TABS
0.5000 mg | ORAL_TABLET | Freq: Three times a day (TID) | ORAL | 0 refills | Status: AC | PRN
Start: 2023-08-16 — End: ?

## 2023-08-16 NOTE — Telephone Encounter (Signed)
Pt called requesting a refill on her Klonopin. Last OV 06/26/23. Last RF 07/16/23. Thank you.

## 2023-09-16 ENCOUNTER — Other Ambulatory Visit: Payer: Self-pay | Admitting: Family Medicine

## 2023-09-16 DIAGNOSIS — J40 Bronchitis, not specified as acute or chronic: Secondary | ICD-10-CM

## 2023-09-17 NOTE — Telephone Encounter (Signed)
Requested medication (s) are due for refill today -yes  Requested medication (s) are on the active medication list -yes  Future visit scheduled -no  Last refill: 08/16/23 #90   Notes to clinic: non delegated Rx  Requested Prescriptions  Pending Prescriptions Disp Refills   clonazePAM (KLONOPIN) 0.5 MG tablet [Pharmacy Med Name: CLONAZEPAM 0.5 MG TABLET] 90 tablet 0    Sig: Take 1 tablet (0.5 mg total) by mouth 3 (three) times daily as needed for anxiety.     Not Delegated - Psychiatry: Anxiolytics/Hypnotics 2 Failed - 09/16/2023  9:25 AM      Failed - This refill cannot be delegated      Failed - Urine Drug Screen completed in last 360 days      Failed - Valid encounter within last 6 months    Recent Outpatient Visits           1 year ago Screening-pulmonary TB   [facility] Pickard, Priscille Heidelberg, MD   1 year ago Benign essential HTN   [facility] Tanya Nones, Priscille Heidelberg, MD   1 year ago Bacterial sinusitis   [facility] Valentino Nose, NP   1 year ago No-show for appointment   [facility] Valentino Nose, NP   2 years ago Viral upper respiratory tract infection   [facility] Pickard, Priscille Heidelberg, MD              Passed - Patient is not pregnant         Requested Prescriptions  Pending Prescriptions Disp Refills   clonazePAM (KLONOPIN) 0.5 MG tablet [Pharmacy Med Name: CLONAZEPAM 0.5 MG TABLET] 90 tablet 0    Sig: Take 1 tablet (0.5 mg total) by mouth 3 (three) times daily as needed for anxiety.     Not Delegated - Psychiatry: Anxiolytics/Hypnotics 2 Failed - 09/16/2023  9:25 AM      Failed - This refill cannot be delegated      Failed - Urine Drug Screen completed in last 360 days      Failed - Valid encounter within last 6 months    Recent Outpatient Visits           1 year ago Screening-pulmonary TB   [facility] Pickard, Priscille Heidelberg, MD   1 year ago Benign  essential HTN   [facility] Donita Brooks, MD   1 year ago Bacterial sinusitis   [facility] Valentino Nose, NP   1 year ago No-show for appointment   [facility] Valentino Nose, NP   2 years ago Viral upper respiratory tract infection   [facility] [place]ard, Priscille Heidelberg, MD              Passed - Patient is not pregnant

## 2023-09-19 NOTE — Telephone Encounter (Signed)
Patient called to follow up on refill requested for clonazePAM (KLONOPIN) 0.5 MG tablet   Patient will run out if pills in the next day.   Pharmacy confirmed as:  [id number] [place], [place] - [address] AT [address] [place] [address] Phone: [phone]  Fax: [phone] DEA #: [id number]    Please advise at [phone]

## 2023-10-15 ENCOUNTER — Other Ambulatory Visit: Payer: Self-pay | Admitting: Family Medicine

## 2023-10-15 DIAGNOSIS — J40 Bronchitis, not specified as acute or chronic: Secondary | ICD-10-CM

## 2023-10-16 NOTE — Telephone Encounter (Signed)
Requested medication (s) are due for refill today:   Provider to review  Requested medication (s) are on the active medication list:   Yes  Future visit scheduled:   No   LOV 06/25/2023    Last ordered: 09/20/2023   Returned because it's a non delegated refill    Requested Prescriptions  Pending Prescriptions Disp Refills   clonazePAM (KLONOPIN) 0.5 MG tablet [Pharmacy Med Name: CLONAZEPAM 0.5 MG TABLET] 90 tablet 0    Sig: TAKE 1 TABLET (0.5 MG TOTAL) BY MOUTH 3 (THREE) TIMES DAILY AS NEEDED FOR ANXIETY.     Not Delegated - Psychiatry: Anxiolytics/Hypnotics 2 Failed - 10/15/2023 11:12 PM      Failed - This refill cannot be delegated      Failed - Urine Drug Screen completed in last 360 days      Failed - Valid encounter within last 6 months    Recent Outpatient Visits           1 year ago Screening-pulmonary TB   [facility] Pickard, Priscille Heidelberg, MD   1 year ago Benign essential HTN   [facility] Donita Brooks, MD   1 year ago Bacterial sinusitis   [facility] Valentino Nose, NP   1 year ago No-show for appointment   [facility] Valentino Nose, NP   2 years ago Viral upper respiratory tract infection   [facility] Pickard, Priscille Heidelberg, MD              Passed - Patient is not pregnant

## 2023-10-18 ENCOUNTER — Telehealth: Payer: Self-pay

## 2023-10-18 ENCOUNTER — Other Ambulatory Visit: Payer: Self-pay | Admitting: Family Medicine

## 2023-10-18 DIAGNOSIS — J40 Bronchitis, not specified as acute or chronic: Secondary | ICD-10-CM

## 2023-10-18 MED ORDER — CLONAZEPAM 0.5 MG PO TABS: 0.5000 mg | ORAL_TABLET | Freq: Three times a day (TID) | ORAL | 0 refills | Status: DC | PRN

## 2023-10-18 NOTE — Telephone Encounter (Signed)
Pt called in to request a refill of this med clonazePAM (KLONOPIN) 0.5 MG tablet [[id number]].  LOV:06/25/23  PHARMACY: [id number] [place], [place] - [address] AT [address] [place] [address] Phone: [phone]  Fax: [phone] DEA #: [id number]    CB#: [phone]

## 2023-11-02 ENCOUNTER — Other Ambulatory Visit: Payer: Self-pay | Admitting: Family Medicine

## 2023-11-02 DIAGNOSIS — I10 Essential (primary) hypertension: Secondary | ICD-10-CM

## 2023-11-02 DIAGNOSIS — F410 Panic disorder [episodic paroxysmal anxiety] without agoraphobia: Secondary | ICD-10-CM

## 2023-11-05 NOTE — Telephone Encounter (Signed)
Requested Prescriptions  Pending Prescriptions Disp Refills   venlafaxine XR (EFFEXOR-XR) 150 MG 24 hr capsule [Pharmacy Med Name: VENLAFAXINE HCL ER 150 MG CAP] 90 capsule 0    Sig: TAKE 1 CAPSULE BY MOUTH DAILY WITH BREAKFAST.     Psychiatry: Antidepressants - SNRI - desvenlafaxine & venlafaxine Failed - 11/02/2023  2:30 PM      Failed - Valid encounter within last 6 months    Recent Outpatient Visits           1 year ago Screening-pulmonary TB   [facility] Pickard, Priscille Heidelberg, MD   1 year ago Benign essential HTN   [facility] Tanya Nones, Priscille Heidelberg, MD   1 year ago Bacterial sinusitis   [facility] Valentino Nose, NP   1 year ago No-show for appointment   [facility] Valentino Nose, NP   2 years ago Viral upper respiratory tract infection   [facility] Donita Brooks, MD              Failed - Lipid Panel in normal range within the last 12 months    Cholesterol  Date Value Ref Range Status  05/28/2023 183 <200 mg/dL Final   LDL Cholesterol (Calc)  Date Value Ref Range Status  05/28/2023 76 mg/dL (calc) Final    Comment:    Reference range: <100 . Desirable range <100 mg/dL for primary prevention;   <70 mg/dL for patients with CHD or diabetic patients  with > or = 2 CHD risk factors. Marland Kitchen LDL-C is now calculated using the Martin-Hopkins  calculation, which is a validated novel method providing  better accuracy than the Friedewald equation in the  estimation of LDL-C.  Horald Pollen et al. Lenox Ahr. 2725;366(44): 2061-2068  (http://education.[facility].com/faq/FAQ164)    HDL  Date Value Ref Range Status  05/28/2023 91 > OR = 50 mg/dL Final   Triglycerides  Date Value Ref Range Status  05/28/2023 76 <150 mg/dL Final         Passed - Cr in normal range and within 360 days    Creat  Date Value Ref Range Status  05/28/2023 0.65 0.50 - 0.99 mg/dL Final         Passed -  Last BP in normal range    BP Readings from Last 1 Encounters:  06/25/23 124/62          losartan (COZAAR) 50 MG tablet [Pharmacy Med Name: LOSARTAN POTASSIUM 50 MG TAB] 90 tablet     Sig: TAKE 1 TABLET BY MOUTH EVERY DAY     Cardiovascular:  Angiotensin Receptor Blockers Failed - 11/02/2023  2:30 PM      Failed - Valid encounter within last 6 months    Recent Outpatient Visits           1 year ago Screening-pulmonary TB   [facility] Pickard, Priscille Heidelberg, MD   1 year ago Benign essential HTN   [facility] Donita Brooks, MD   1 year ago Bacterial sinusitis   [facility] Valentino Nose, NP   1 year ago No-show for appointment   [facility] Valentino Nose, NP   2 years ago Viral upper respiratory tract infection   [facility] Pickard, Priscille Heidelberg, MD              Passed - Cr in normal range and within 180  days    Creat  Date Value Ref Range Status  05/28/2023 0.65 0.50 - 0.99 mg/dL Final         Passed - K in normal range and within 180 days    Potassium  Date Value Ref Range Status  05/28/2023 4.2 3.5 - 5.3 mmol/L Final         Passed - Patient is not pregnant      Passed - Last BP in normal range    BP Readings from Last 1 Encounters:  06/25/23 124/62

## 2023-11-05 NOTE — Telephone Encounter (Signed)
Reordered 06/07/23 # 90 1 RF   Requested Prescriptions  Signed Prescriptions Disp Refills   venlafaxine XR (EFFEXOR-XR) 150 MG 24 hr capsule 90 capsule 0    Sig: TAKE 1 CAPSULE BY MOUTH DAILY WITH BREAKFAST.     Psychiatry: Antidepressants - SNRI - desvenlafaxine & venlafaxine Failed - 11/02/2023  2:30 PM      Failed - Valid encounter within last 6 months    Recent Outpatient Visits           1 year ago Screening-pulmonary TB   [facility] Pickard, Priscille Heidelberg, MD   1 year ago Benign essential HTN   [facility] Tanya Nones, Priscille Heidelberg, MD   1 year ago Bacterial sinusitis   [facility] Valentino Nose, NP   1 year ago No-show for appointment   [facility] Valentino Nose, NP   2 years ago Viral upper respiratory tract infection   [facility] Donita Brooks, MD              Failed - Lipid Panel in normal range within the last 12 months    Cholesterol  Date Value Ref Range Status  05/28/2023 183 <200 mg/dL Final   LDL Cholesterol (Calc)  Date Value Ref Range Status  05/28/2023 76 mg/dL (calc) Final    Comment:    Reference range: <100 . Desirable range <100 mg/dL for primary prevention;   <70 mg/dL for patients with CHD or diabetic patients  with > or = 2 CHD risk factors. Marland Kitchen LDL-C is now calculated using the Martin-Hopkins  calculation, which is a validated novel method providing  better accuracy than the Friedewald equation in the  estimation of LDL-C.  Horald Pollen et al. Lenox Ahr. 1610;960(45): 2061-2068  (http://education.[facility].com/faq/FAQ164)    HDL  Date Value Ref Range Status  05/28/2023 91 > OR = 50 mg/dL Final   Triglycerides  Date Value Ref Range Status  05/28/2023 76 <150 mg/dL Final         Passed - Cr in normal range and within 360 days    Creat  Date Value Ref Range Status  05/28/2023 0.65 0.50 - 0.99 mg/dL Final         Passed - Last BP in normal  range    BP Readings from Last 1 Encounters:  06/25/23 124/62         Refused Prescriptions Disp Refills   losartan (COZAAR) 50 MG tablet [Pharmacy Med Name: LOSARTAN POTASSIUM 50 MG TAB] 90 tablet     Sig: TAKE 1 TABLET BY MOUTH EVERY DAY     Cardiovascular:  Angiotensin Receptor Blockers Failed - 11/02/2023  2:30 PM      Failed - Valid encounter within last 6 months    Recent Outpatient Visits           1 year ago Screening-pulmonary TB   [facility] Pickard, Priscille Heidelberg, MD   1 year ago Benign essential HTN   [facility] Donita Brooks, MD   1 year ago Bacterial sinusitis   [facility] Valentino Nose, NP   1 year ago No-show for appointment   [facility] Valentino Nose, NP   2 years ago Viral upper respiratory tract infection   [facility] Pickard, Priscille Heidelberg, MD              Passed - Cr in normal  range and within 180 days    Creat  Date Value Ref Range Status  05/28/2023 0.65 0.50 - 0.99 mg/dL Final         Passed - K in normal range and within 180 days    Potassium  Date Value Ref Range Status  05/28/2023 4.2 3.5 - 5.3 mmol/L Final         Passed - Patient is not pregnant      Passed - Last BP in normal range    BP Readings from Last 1 Encounters:  06/25/23 124/62

## 2023-11-19 ENCOUNTER — Telehealth: Payer: Self-pay

## 2023-11-19 ENCOUNTER — Other Ambulatory Visit: Payer: Self-pay | Admitting: Family Medicine

## 2023-11-19 DIAGNOSIS — J40 Bronchitis, not specified as acute or chronic: Secondary | ICD-10-CM

## 2023-11-19 MED ORDER — CLONAZEPAM 0.5 MG PO TABS: 0.5000 mg | ORAL_TABLET | Freq: Three times a day (TID) | ORAL | 0 refills | Status: DC | PRN

## 2023-11-19 NOTE — Telephone Encounter (Signed)
Copied from CRM [id number]. Topic: Clinical - Medication Refill >> Nov 19, 2023 10:35 AM Conni Elliot wrote: Most Recent Primary Care Visit:  Provider: Lynnea Ferrier T  Department[facility]  Visit Type: OFFICE VISIT  Date: 06/25/2023  Medication: clonazePAM (KLONOPIN) 0.5 MG tablet  Has the patient contacted their pharmacy? Yes (Agent: If no, request that the patient contact the pharmacy for the refill. If patient does not wish to contact the pharmacy document the reason why and proceed with request.) (Agent: If yes, when and what did the pharmacy advise?) no response  Is this the correct pharmacy for this prescription? Yes If no, delete pharmacy and type the correct one.  This is the patient's preferred pharmacy:  [id number] [place], [place] - [address] [place] [address] Phone: [phone] Fax: [phone]   Has the prescription been filled recently? Yes  Is the patient out of the medication? No  Has the patient been seen for an appointment in the last year OR does the patient have an upcoming appointment? Yes  Can we respond through MyChart? Yes  Agent: Please be advised that Rx refills may take up to 3 business days. We ask that you follow-up with your pharmacy.

## 2023-12-02 ENCOUNTER — Other Ambulatory Visit: Payer: Self-pay | Admitting: Family Medicine

## 2023-12-02 DIAGNOSIS — F410 Panic disorder [episodic paroxysmal anxiety] without agoraphobia: Secondary | ICD-10-CM

## 2023-12-02 DIAGNOSIS — I10 Essential (primary) hypertension: Secondary | ICD-10-CM

## 2023-12-03 ENCOUNTER — Other Ambulatory Visit: Payer: Self-pay

## 2023-12-03 DIAGNOSIS — I10 Essential (primary) hypertension: Secondary | ICD-10-CM

## 2023-12-03 DIAGNOSIS — F410 Panic disorder [episodic paroxysmal anxiety] without agoraphobia: Secondary | ICD-10-CM

## 2023-12-03 MED ORDER — LOSARTAN POTASSIUM 50 MG PO TABS: 50.0000 mg | ORAL_TABLET | Freq: Every day | ORAL | 1 refills | Status: DC

## 2023-12-18 ENCOUNTER — Other Ambulatory Visit: Payer: Self-pay | Admitting: Family Medicine

## 2023-12-18 DIAGNOSIS — J40 Bronchitis, not specified as acute or chronic: Secondary | ICD-10-CM

## 2024-01-20 ENCOUNTER — Other Ambulatory Visit: Payer: Self-pay | Admitting: Family Medicine

## 2024-01-20 DIAGNOSIS — J40 Bronchitis, not specified as acute or chronic: Secondary | ICD-10-CM

## 2024-01-21 ENCOUNTER — Other Ambulatory Visit: Payer: Self-pay | Admitting: Family Medicine

## 2024-01-21 DIAGNOSIS — J40 Bronchitis, not specified as acute or chronic: Secondary | ICD-10-CM

## 2024-01-21 MED ORDER — CLONAZEPAM 0.5 MG PO TABS: 0.5000 mg | ORAL_TABLET | Freq: Three times a day (TID) | ORAL | 0 refills | Status: DC | PRN

## 2024-01-21 NOTE — Telephone Encounter (Signed)
Copied from CRM [id number]. Topic: Clinical - Prescription Issue >> Jan 21, 2024  9:55 AM Shelah Lewandowsky wrote: Reason for CRM: clonazePAM (KLONOPIN) 0.5 MG tablet waiting for prior authorization, has one pill left

## 2024-02-01 ENCOUNTER — Other Ambulatory Visit: Payer: Self-pay | Admitting: Family Medicine

## 2024-02-20 ENCOUNTER — Other Ambulatory Visit: Payer: Self-pay | Admitting: Family Medicine

## 2024-02-20 DIAGNOSIS — J40 Bronchitis, not specified as acute or chronic: Secondary | ICD-10-CM

## 2024-02-22 ENCOUNTER — Telehealth: Payer: Self-pay

## 2024-02-22 ENCOUNTER — Other Ambulatory Visit: Payer: Self-pay | Admitting: Family Medicine

## 2024-02-22 DIAGNOSIS — J40 Bronchitis, not specified as acute or chronic: Secondary | ICD-10-CM

## 2024-02-22 MED ORDER — CLONAZEPAM 0.5 MG PO TABS: 0.5000 mg | ORAL_TABLET | Freq: Three times a day (TID) | ORAL | 0 refills | Status: DC | PRN

## 2024-02-22 NOTE — Telephone Encounter (Signed)
 Copied from CRM [id number]. Topic: Clinical - Prescription Issue >> Feb 22, 2024  8:46 AM Desma Mcgregor wrote: Reason for CRM: Dr denied pt's refill for clonazePAM (KLONOPIN) 0.5 MG tablet, however, the pt is out of the medication and says she cannot go until her 3/18 appt without it. Is there any way that an ER fill can be approved?

## 2024-02-22 NOTE — Telephone Encounter (Signed)
 Copied from CRM [id number]. Topic: Clinical - Prescription Issue >> Feb 22, 2024  1:53 PM Santiya F wrote: Reason for CRM: Patient is calling in because she is requesting an emergency refill on her medication clonazePAM (KLONOPIN) 0.5 MG tablet. Patient says her refill request was denied due to her needing an appointment but she scheduled one and wants to know if she can get it refilled, or get enough to last her until the 18th. Patient says she took her last half and will be completely out.

## 2024-03-04 ENCOUNTER — Ambulatory Visit: Admitting: Family Medicine

## 2024-03-11 ENCOUNTER — Ambulatory Visit: Payer: Self-pay
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
## 2024-03-11 NOTE — Telephone Encounter (Signed)
 Copied from CRM [id number]. Topic: General - Other >> Mar 11, 2024  3:56 PM Tiffany S wrote: Reason for CRM: Patient is highly stressed and had a melt down at work and is asking for a doctors note for leaving work today. Please follow up with the patient

## 2024-03-11 NOTE — Telephone Encounter (Signed)
 This RN attempted return call #1. No answer. LVM.

## 2024-03-14 ENCOUNTER — Ambulatory Visit: Admitting: Family Medicine

## 2024-03-14 ENCOUNTER — Encounter: Payer: Self-pay | Admitting: Family Medicine

## 2024-03-14 VITALS — BP 132/86 | HR 96 | Temp 98.9°F | Ht 59.0 in | Wt 111.8 lb

## 2024-03-14 DIAGNOSIS — F411 Generalized anxiety disorder: Secondary | ICD-10-CM

## 2024-03-14 MED ORDER — VENLAFAXINE HCL ER 75 MG PO CP24
225.0000 mg | ORAL_CAPSULE | Freq: Every day | ORAL | 5 refills | Status: DC
Start: 2024-03-14 — End: 2024-08-21

## 2024-03-14 MED ORDER — BUSPIRONE HCL 7.5 MG PO TABS: 7.5000 mg | ORAL_TABLET | Freq: Two times a day (BID) | ORAL | 3 refills | Status: DC
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
## 2024-03-18 ENCOUNTER — Encounter: Payer: Self-pay | Admitting: Family Medicine

## 2024-03-20 ENCOUNTER — Telehealth: Admitting: Family Medicine

## 2024-03-25 ENCOUNTER — Other Ambulatory Visit: Payer: Self-pay | Admitting: Family Medicine

## 2024-03-25 ENCOUNTER — Telehealth: Payer: Self-pay

## 2024-03-25 DIAGNOSIS — J40 Bronchitis, not specified as acute or chronic: Secondary | ICD-10-CM

## 2024-03-25 MED ORDER — CLONAZEPAM 0.5 MG PO TABS: 0.5000 mg | ORAL_TABLET | Freq: Three times a day (TID) | ORAL | 0 refills | Status: DC | PRN

## 2024-03-25 NOTE — Telephone Encounter (Signed)
 Copied from CRM [id number]. Topic: Clinical - Prescription Issue >> Mar 25, 2024  1:02 PM Fredrica W wrote: Reason for CRM: Patient called about prescription refill request. She submitted over Mychart and phone. States has not yet been received by pharmacy. She one has 1 left for tonight and will not have any for tomorrow. She gets off about 4:30 and would like to be able to pick up on the way home if possible. Thank You

## 2024-03-25 NOTE — Telephone Encounter (Signed)
 Patient called about prescription refill request. She submitted over Mychart and phone. States has not yet been received by pharmacy. She one has 1 left for tonight and will not have any for tomorrow. She gets off about 4:30 and would like to be able to pick up on the way home if possible. Thank You

## 2024-03-25 NOTE — Telephone Encounter (Signed)
 Requested medication (s) are due for refill today: Yes  Requested medication (s) are on the active medication list: Yes  Last refill:  03/25/24  Future visit scheduled: No  Notes to clinic:  Unable to refill per protocol, cannot delegate.      Requested Prescriptions  Pending Prescriptions Disp Refills   clonazePAM (KLONOPIN) 0.5 MG tablet [Pharmacy Med Name: CLONAZEPAM 0.5 MG TABLET] 90 tablet 0    Sig: Take 1 tablet (0.5 mg total) by mouth 3 (three) times daily as needed for anxiety.     Not Delegated - Psychiatry: Anxiolytics/Hypnotics 2 Failed - 03/25/2024  4:38 PM      Failed - This refill cannot be delegated      Failed - Urine Drug Screen completed in last 360 days      Passed - Patient is not pregnant      Passed - Valid encounter within last 6 months    Recent Outpatient Visits           1 week ago GAD (generalized anxiety disorder)   [place] [facility] Pickard, Priscille Heidelberg, MD   9 months ago Right sided sciatica   [place] [facility] Pickard, Priscille Heidelberg, MD   10 months ago Pure hypercholesterolemia   [place] [facility] Pickard, Priscille Heidelberg, MD   1 year ago Traumatic hematoma of left forearm, subsequent encounter   [place] [facility] Donita Brooks, MD   1 year ago Bronchitis   [place] [facility] Pickard, Priscille Heidelberg, MD

## 2024-03-27 DIAGNOSIS — Z0279 Encounter for issue of other medical certificate: Secondary | ICD-10-CM

## 2024-03-31 NOTE — Telephone Encounter (Addendum)
 Completed FMLA forms faxed to [employer], Attn: Lemond Quail [facility] Benefits Manager) at [phone]. Received confirmation time stamp of 03/31/2024 12:18  Also successfully faxed to [facility] Admin with a confirmation time stamp of 03-31-2024 12:26  As requested by patient, I mailed the originals to the confirmed home address on file.

## 2024-04-04 ENCOUNTER — Other Ambulatory Visit: Payer: Self-pay | Admitting: Family Medicine

## 2024-04-04 DIAGNOSIS — F410 Panic disorder [episodic paroxysmal anxiety] without agoraphobia: Secondary | ICD-10-CM

## 2024-04-04 DIAGNOSIS — I10 Essential (primary) hypertension: Secondary | ICD-10-CM

## 2024-04-04 NOTE — Telephone Encounter (Signed)
 Too soon for refill.  Requested Prescriptions  Pending Prescriptions Disp Refills   losartan  (COZAAR ) 50 MG tablet [Pharmacy Med Name: LOSARTAN  POTASSIUM 50 MG TAB] 90 tablet 1    Sig: TAKE 1 TABLET BY MOUTH EVERY DAY     Cardiovascular:  Angiotensin Receptor Blockers Failed - 04/04/2024  4:04 PM      Failed - Cr in normal range and within 180 days    Creat  Date Value Ref Range Status  05/28/2023 0.65 0.50 - 0.99 mg/dL Final         Failed - K in normal range and within 180 days    Potassium  Date Value Ref Range Status  05/28/2023 4.2 3.5 - 5.3 mmol/L Final         Passed - Patient is not pregnant      Passed - Last BP in normal range    BP Readings from Last 1 Encounters:  03/14/24 132/86         Passed - Valid encounter within last 6 months    Recent Outpatient Visits           3 weeks ago GAD (generalized anxiety disorder)   [place] [facility] Austine Lefort, MD   9 months ago Right sided sciatica   [place] [facility] Cheril Cork, Cisco Crest, MD   10 months ago Pure hypercholesterolemia   [place] [facility] Pickard, Cisco Crest, MD   1 year ago Traumatic hematoma of left forearm, subsequent encounter   [place] [facility] Austine Lefort, MD   1 year ago Bronchitis   [place] [facility] Pickard, Cisco Crest, MD

## 2024-04-05 ENCOUNTER — Other Ambulatory Visit: Payer: Self-pay | Admitting: Family Medicine

## 2024-04-07 ENCOUNTER — Other Ambulatory Visit: Payer: Self-pay

## 2024-04-07 ENCOUNTER — Other Ambulatory Visit: Payer: Self-pay | Admitting: Family Medicine

## 2024-04-07 DIAGNOSIS — F410 Panic disorder [episodic paroxysmal anxiety] without agoraphobia: Secondary | ICD-10-CM

## 2024-04-07 DIAGNOSIS — I10 Essential (primary) hypertension: Secondary | ICD-10-CM

## 2024-04-07 MED ORDER — LOSARTAN POTASSIUM 50 MG PO TABS: 50.0000 mg | ORAL_TABLET | Freq: Every day | ORAL | 1 refills | Status: DC

## 2024-04-07 NOTE — Telephone Encounter (Signed)
 Too soon for refill.  Requested Prescriptions  Pending Prescriptions Disp Refills   busPIRone  (BUSPAR ) 7.5 MG tablet [Pharmacy Med Name: BUSPIRONE  HCL 7.5 MG TABLET] 180 tablet 2    Sig: TAKE 1 TABLET BY MOUTH 2 TIMES DAILY.     Psychiatry: Anxiolytics/Hypnotics - Non-controlled Passed - 04/07/2024 11:56 AM      Passed - Valid encounter within last 12 months    Recent Outpatient Visits           3 weeks ago GAD (generalized anxiety disorder)   [place] [facility] Pickard, Cisco Crest, MD   9 months ago Right sided sciatica   [facility] Pickard, Cisco Crest, MD   10 months ago Pure hypercholesterolemia   [place] [facility] Pickard, Cisco Crest, MD   1 year ago Traumatic hematoma of left forearm, subsequent encounter   [facility] Austine Lefort, MD   1 year ago Bronchitis   [place] [facility] Cheril Cork, Cisco Crest, MD               venlafaxine  XR (EFFEXOR -XR) 75 MG 24 hr capsule [Pharmacy Med Name: VENLAFAXINE  HCL ER 75 MG CAP] 270 capsule 2    Sig: TAKE 3 CAPSULES (225 MG TOTAL) BY MOUTH DAILY WITH BREAKFAST     Psychiatry: Antidepressants - SNRI - desvenlafaxine & venlafaxine  Failed - 04/07/2024 11:56 AM      Failed - Lipid Panel in normal range within the last 12 months    Cholesterol  Date Value Ref Range Status  05/28/2023 183 <200 mg/dL Final   LDL Cholesterol (Calc)  Date Value Ref Range Status  05/28/2023 76 mg/dL (calc) Final    Comment:    Reference range: <100 . Desirable range <100 mg/dL for primary prevention;   <70 mg/dL for patients with CHD or diabetic patients  with > or = 2 CHD risk factors. Aaron Aas LDL-C is now calculated using the Martin-Hopkins  calculation, which is a validated novel method providing  better accuracy than the Friedewald equation in the  estimation of LDL-C.  Melinda Sprawls et al. Erroll Heard. 1191;478(29): 2061-2068   (http://education.[facility].com/faq/FAQ164)    HDL  Date Value Ref Range Status  05/28/2023 91 > OR = 50 mg/dL Final   Triglycerides  Date Value Ref Range Status  05/28/2023 76 <150 mg/dL Final         Passed - Cr in normal range and within 360 days    Creat  Date Value Ref Range Status  05/28/2023 0.65 0.50 - 0.99 mg/dL Final         Passed - Last BP in normal range    BP Readings from Last 1 Encounters:  03/14/24 132/86         Passed - Valid encounter within last 6 months    Recent Outpatient Visits           3 weeks ago GAD (generalized anxiety disorder)   [place] [facility] Austine Lefort, MD   9 months ago Right sided sciatica   [place] [facility] Pickard, Cisco Crest, MD   10 months ago Pure hypercholesterolemia   [facility] Pickard, Cisco Crest, MD   1 year ago Traumatic hematoma of left forearm, subsequent encounter   [place] [facility] Austine Lefort, MD   1 year ago Bronchitis   [place]  [facility] Pickard, Cisco Crest, MD

## 2024-04-07 NOTE — Telephone Encounter (Signed)
 Copied from CRM [id number]. Topic: Clinical - Medication Refill >> Apr 07, 2024 10:13 AM Lorrane Rosette wrote: Most Recent Primary Care Visit:  Provider: Eliane Grooms T  Department: [facility]  Visit Type: OFFICE VISIT  Date: 03/14/2024  Medication: losartan  (COZAAR ) 50 MG tablet  Has the patient contacted their pharmacy? Yes  Is this the correct pharmacy for this prescription? Yes If no, delete pharmacy and type the correct one.  This is the patient's preferred pharmacy:  [id number], [place] - [address] [place] [address] Phone: [phone] Fax: [phone]   Has the prescription been filled recently? No  Is the patient out of the medication? No - Patient only has 1 pill remaining  Has the patient been seen for an appointment in the last year OR does the patient have an upcoming appointment? Yes  Can we respond through MyChart? Yes  Agent: Please be advised that Rx refills may take up to 3 business days. We ask that you follow-up with your pharmacy.

## 2024-04-08 NOTE — Telephone Encounter (Signed)
 Disp Refills Start End   losartan  (COZAAR ) 50 MG tablet 90 tablet 1 04/07/2024 --   Sig - Route: Take 1 tablet (50 mg total) by mouth daily. - Oral   Sent to pharmacy as: losartan  (COZAAR ) 50 MG tablet   E-Prescribing Status: Receipt confirmed by pharmacy (04/07/2024 10:37 AM EDT)    Requested Prescriptions  Pending Prescriptions Disp Refills   losartan  (COZAAR ) 50 MG tablet 90 tablet 1    Sig: Take 1 tablet (50 mg total) by mouth daily.     Cardiovascular:  Angiotensin Receptor Blockers Failed - 04/08/2024 10:11 AM      Failed - Cr in normal range and within 180 days    Creat  Date Value Ref Range Status  05/28/2023 0.65 0.50 - 0.99 mg/dL Final         Failed - K in normal range and within 180 days    Potassium  Date Value Ref Range Status  05/28/2023 4.2 3.5 - 5.3 mmol/L Final         Passed - Patient is not pregnant      Passed - Last BP in normal range    BP Readings from Last 1 Encounters:  03/14/24 132/86         Passed - Valid encounter within last 6 months    Recent Outpatient Visits           3 weeks ago GAD (generalized anxiety disorder)   [place] [facility] Austine Lefort, MD   9 months ago Right sided sciatica   [place] [facility] Cheril Cork, Cisco Crest, MD   10 months ago Pure hypercholesterolemia   [place] [facility] Pickard, Cisco Crest, MD   1 year ago Traumatic hematoma of left forearm, subsequent encounter   [place] [facility] Austine Lefort, MD   1 year ago Bronchitis   [place] [facility] Medicine Pickard, Cisco Crest, MD

## 2024-04-22 ENCOUNTER — Other Ambulatory Visit: Payer: Self-pay | Admitting: Family Medicine

## 2024-04-22 DIAGNOSIS — J40 Bronchitis, not specified as acute or chronic: Secondary | ICD-10-CM

## 2024-04-23 NOTE — Telephone Encounter (Signed)
 Requested medication (s) are due for refill today: yes  Requested medication (s) are on the active medication list: yes  Last refill:  03/25/24  Future visit scheduled: no  Notes to clinic:  Unable to refill per protocol, cannot delegate.      Requested Prescriptions  Pending Prescriptions Disp Refills   clonazePAM  (KLONOPIN ) 0.5 MG tablet [Pharmacy Med Name: CLONAZEPAM  0.5 MG TABLET] 90 tablet 0    Sig: Take 1 tablet (0.5 mg total) by mouth 3 (three) times daily as needed for anxiety.     Not Delegated - Psychiatry: Anxiolytics/Hypnotics 2 Failed - 04/23/2024  2:49 PM      Failed - This refill cannot be delegated      Failed - Urine Drug Screen completed in last 360 days      Passed - Patient is not pregnant      Passed - Valid encounter within last 6 months    Recent Outpatient Visits           1 month ago GAD (generalized anxiety disorder)   [place] [facility] Pickard, Cisco Crest, MD   10 months ago Right sided sciatica   [place] [facility] Medicine Pickard, Cisco Crest, MD   11 months ago Pure hypercholesterolemia   [place] [facility] Medicine Pickard, Cisco Crest, MD   1 year ago Traumatic hematoma of left forearm, subsequent encounter   [place] [facility] Medicine Austine Lefort, MD   1 year ago Bronchitis   [place] [facility] Medicine Pickard, Cisco Crest, MD

## 2024-04-24 ENCOUNTER — Ambulatory Visit: Admitting: Family Medicine

## 2024-04-24 MED ORDER — CLONAZEPAM 0.5 MG PO TABS
0.5000 mg | ORAL_TABLET | Freq: Three times a day (TID) | ORAL | 0 refills | Status: DC | PRN
Start: 2024-04-24 — End: 2024-05-27

## 2024-05-15 ENCOUNTER — Ambulatory Visit: Payer: Self-pay

## 2024-05-15 ENCOUNTER — Other Ambulatory Visit: Payer: Self-pay | Admitting: Family Medicine

## 2024-05-15 ENCOUNTER — Telehealth: Payer: Self-pay | Admitting: Family Medicine

## 2024-05-15 NOTE — Telephone Encounter (Signed)
 Chief Complaint: Right sciatica pain going down right leg Symptoms: Right pain shooting down right leg and into right hip Frequency: 3 days Pertinent Negatives: Patient denies urinary symptoms and fever Disposition: [] ED /[] Urgent Care (no appt availability in office) / [] Appointment(In office/virtual)/ []  [place] [facility]/ [] Home Care/ [] Refused Recommended Disposition /[] [place] [facility]/ [x]  Follow-up with PCP Additional Notes: Patient called in requesting a refill of her last prescription of Prednisone , stating she had it prescribed for her Sciatic pain on her right side and it is the only thing that has helped. Patient triaged and determined pain is likely Right Side Sciatic pain again and she states she has tried ibuprofen , stretches, heat, cold, and nothing helps. Patient has just started a new job that requires bending, stretching, and movement that exacerbates the nerve pain. Patient tearful and stating she cannot miss work, and asking if [facility] [address] [facility]. Patient informed she may be contacted if there are further questions or concerns.   Reason for Disposition  Caused by a twisting, bending, or lifting injury    Right Sciatic Pain - please see notes  Answer Assessment - Initial Assessment Questions 1. ONSET: "When did the pain begin?"      Three days ago with this current flare up 2. LOCATION: "Where does it hurt?" (upper, mid or lower back)     Right side sciatic pain  3. SEVERITY: "How bad is the pain?"  (e.g., Scale 1-10; mild, moderate, or severe)   - MILD (1-3): Doesn't interfere with normal activities.    - MODERATE (4-7): Interferes with normal activities or awakens from sleep.    - SEVERE (8-10): Excruciating pain, unable to do any normal activities.      Severe - stating patient was in tears 4. PATTERN: "Is the pain constant?" (e.g., yes, no; constant, intermittent)      Comes and goes 5. RADIATION: "Does the pain  shoot into your legs or somewhere else?"     Radiating down right leg and around right hip 6. CAUSE:  "What do you think is causing the back pain?"      A few months ago put out of work for severe right side sciatica 7. BACK OVERUSE:  "Any recent lifting of heavy objects, strenuous work or exercise?"     Patient started a new job about a month ago with bending, squatting 8. MEDICINES: "What have you taken so far for the pain?" (e.g., nothing, acetaminophen , NSAIDS)     Prednisone  has helped with the sciatic pain 9. NEUROLOGIC SYMPTOMS: "Do you have any weakness, numbness, or problems with bowel/bladder control?"     No 10. OTHER SYMPTOMS: "Do you have any other symptoms?" (e.g., fever, abdomen pain, burning with urination, blood in urine)       No  Protocols used: Back Pain-A-AH

## 2024-05-15 NOTE — Telephone Encounter (Signed)
 Copied from CRM [id number]. Topic: Clinical - Medication Refill >> May 15, 2024  5:00 PM Phil Braun wrote: Medication: predniSONE  (DELTASONE ) 20 MG tablet  Has the patient contacted their pharmacy? No (Agent: If no, request that the patient contact the pharmacy for the refill. If patient does not wish to contact the pharmacy document the reason why and proceed with request.) (Agent: If yes, when and what did the pharmacy advise?)  This is the patient's preferred pharmacy:  [id number] [place] [facility], [place] - [address] [place] [address] Phone: [phone] Fax: [phone]  Is this the correct pharmacy for this prescription? Yes If no, delete pharmacy and type the correct one.   Has the prescription been filled recently? No  Is the patient out of the medication? Yes  Has the patient been seen for an appointment in the last year OR does the patient have an upcoming appointment? Yes  Can we respond through MyChart? Yes  Agent: Please be advised that Rx refills may take up to 3 business days. We ask that you follow-up with your pharmacy.

## 2024-05-15 NOTE — Telephone Encounter (Unsigned)
 Copied from CRM [id number]. Topic: Clinical - Medication Refill >> May 15, 2024  5:00 PM Phil Braun wrote: Medication: predniSONE  (DELTASONE ) 20 MG tablet  Has the patient contacted their pharmacy? No (Agent: If no, request that the patient contact the pharmacy for the refill. If patient does not wish to contact the pharmacy document the reason why and proceed with request.) (Agent: If yes, when and what did the pharmacy advise?)  This is the patient's preferred pharmacy:  [id number] [place], [place] - [address] [place] [address] Phone: [phone] Fax: [phone]  Is this the correct pharmacy for this prescription? Yes If no, delete pharmacy and type the correct one.   Has the prescription been filled recently? No  Is the patient out of the medication? Yes  Has the patient been seen for an appointment in the last year OR does the patient have an upcoming appointment? Yes  Can we respond through MyChart? Yes  Agent: Please be advised that Rx refills may take up to 3 business days. We ask that you follow-up with your pharmacy.

## 2024-05-16 ENCOUNTER — Ambulatory Visit: Payer: Self-pay

## 2024-05-16 NOTE — Telephone Encounter (Signed)
  Chief Complaint: Requests steroid pak, has been helpful in past. right hip/back pain radiating to right leg Symptoms: pain, burning Frequency: x 2 days Pertinent Negatives: Patient denies numbness to  leg/foot Disposition: [] ED /[x] Urgent Care (no appt availability in office) / [] Appointment(In office/virtual)/ []  [place] [facility]/ [] Home Care/ [] Refused Recommended Disposition /[] [place] [facility]/ []  Follow-up with PCP Additional Notes: patient unable to come in for office visit today due to schedule, advised of UC or ED as alternatives. Patient recently started new job and is unable to take off of work.  Job is physically demanding and patient believes this is causing her pain. Patient requests communication via mychart  Copied from CRM [id number]. Topic: Clinical - Red Word Triage >> May 16, 2024 10:54 AM El Gravely T wrote: [facility] that prompted transfer to Nurse Triage: Intense hip, back, and leg pain radiating down to lower extremities on the right side Reason for Disposition  [1] Pain radiates into the thigh or further down the leg AND [2] one leg  Answer Assessment - Initial Assessment Questions 1. LOCATION and RADIATION: "Where is the pain located?"      Right hip pain, radiating to right leg 2. QUALITY: "What does the pain feel like?"  (e.g., sharp, dull, aching, burning)     Sharp pain to low back/hip, "electrical shocks down leg" 3. SEVERITY: "How bad is the pain?" "What does it keep you from doing?"   (Scale 1-10; or mild, moderate, severe)   -  MILD (1-3): doesn't interfere with normal activities    -  MODERATE (4-7): interferes with normal activities (e.g., work or school) or awakens from sleep, limping    -  SEVERE (8-10): excruciating pain, unable to do any normal activities, unable to walk     8 4. ONSET: "When did the pain start?" "Does it come and go, or is it there all the time?"     Two days ago 5. WORK OR EXERCISE: "Has there been any recent work or exercise  that involved this part of the body?"      Just started new job with bending, squatting, and lifting 6. CAUSE: "What do you think is causing the hip pain?"      New job, on feet 8 hours per day at her job 7. AGGRAVATING FACTORS: "What makes the hip pain worse?" (e.g., walking, climbing stairs, running)     Bending and squatting, prolonged standing 8. OTHER SYMPTOMS: "Do you have any other symptoms?" (e.g., back pain, pain shooting down leg,  fever, rash)     Pain radiating down leg  Protocols used: Hip Pain-A-AH, Back Pain-A-AH

## 2024-05-17 NOTE — Telephone Encounter (Signed)
 Requested medication (s) are due for refill today: review  Requested medication (s) are on the active medication list: no  Last refill:    Future visit scheduled: no  Notes to clinic:  Unable to refill per protocol, cannot delegate. The original prescription was discontinued on 03/14/2024 by Verneda Golder, LPN.      Requested Prescriptions  Pending Prescriptions Disp Refills   predniSONE  (DELTASONE ) 20 MG tablet [Pharmacy Med Name: PREDNISONE  20 MG TABLET] 11 tablet 0    Sig: TAKE 3 TABLET BY MOUTH DAY ONE, THEN 2 TABLETS DAILY X 4 DAYS     Not Delegated - Endocrinology:  Oral Corticosteroids Failed - 05/17/2024  2:26 PM      Failed - This refill cannot be delegated      Failed - Manual Review: Eye exam for IOP if prolonged treatment      Failed - Glucose (serum) in normal range and within 180 days    Glucose, Bld  Date Value Ref Range Status  05/28/2023 76 65 - 99 mg/dL Final    Comment:    .            Fasting reference interval .          Failed - K in normal range and within 180 days    Potassium  Date Value Ref Range Status  05/28/2023 4.2 3.5 - 5.3 mmol/L Final         Failed - Na in normal range and within 180 days    Sodium  Date Value Ref Range Status  05/28/2023 140 135 - 146 mmol/L Final         Failed - Bone Mineral Density or Dexa Scan completed in the last 2 years      Passed - Last BP in normal range    BP Readings from Last 1 Encounters:  03/14/24 132/86         Passed - Valid encounter within last 6 months    Recent Outpatient Visits           2 months ago GAD (generalized anxiety disorder)   [place] [facility] Austine Lefort, MD   10 months ago Right sided sciatica   [place] [facility] Medicine Cheril Cork, Cisco Crest, MD   11 months ago Pure hypercholesterolemia   [place] [facility] Medicine Pickard, Cisco Crest, MD   1 year ago Traumatic hematoma of left forearm, subsequent encounter    [place] [facility] Austine Lefort, MD   1 year ago Bronchitis   [place] [facility] Medicine Pickard, Cisco Crest, MD

## 2024-05-25 ENCOUNTER — Other Ambulatory Visit: Payer: Self-pay | Admitting: Family Medicine

## 2024-05-25 DIAGNOSIS — J40 Bronchitis, not specified as acute or chronic: Secondary | ICD-10-CM

## 2024-05-29 ENCOUNTER — Encounter: Admitting: Family Medicine

## 2024-06-25 ENCOUNTER — Other Ambulatory Visit: Payer: Self-pay | Admitting: Family Medicine

## 2024-06-25 DIAGNOSIS — J40 Bronchitis, not specified as acute or chronic: Secondary | ICD-10-CM

## 2024-07-28 ENCOUNTER — Other Ambulatory Visit: Payer: Self-pay | Admitting: Family Medicine

## 2024-07-28 DIAGNOSIS — J40 Bronchitis, not specified as acute or chronic: Secondary | ICD-10-CM

## 2024-07-29 NOTE — Telephone Encounter (Unsigned)
 Copied from CRM [id number]. Topic: Clinical - Prescription Issue >> Jul 29, 2024 12:55 PM Delon T wrote: Reason for CRM: clonazePAM  (KLONOPIN ) 0.5 MG tablet- checking status of refill- pharmacy states they did not receive any orders.

## 2024-08-06 ENCOUNTER — Other Ambulatory Visit: Payer: Self-pay | Admitting: Family Medicine

## 2024-08-07 NOTE — Telephone Encounter (Signed)
 Requested medications are due for refill today.  unsure  Requested medications are on the active medications list.  yes  Last refill. 02/01/2024 #90 1 rf  Future visit scheduled.   no  Notes to clinic.  Pt has both 150 mg and 75 mg prescribed.    Requested Prescriptions  Pending Prescriptions Disp Refills   venlafaxine  XR (EFFEXOR -XR) 150 MG 24 hr capsule [Pharmacy Med Name: VENLAFAXINE  HCL ER 150 MG CAP] 90 capsule 1    Sig: TAKE 1 CAPSULE BY MOUTH DAILY WITH BREAKFAST.     There is no refill protocol information for this order

## 2024-08-20 ENCOUNTER — Telehealth: Payer: Self-pay

## 2024-08-20 NOTE — Telephone Encounter (Signed)
 Copied from CRM [id number]. Topic: Clinical - Prescription Issue >> Aug 20, 2024  3:48 PM Winona R wrote: Pt states she is suppose to take a total of 225mg  of venlafaxine  XR (EFFEXOR  XR). However her refill was for venlafaxine  XR (EFFEXOR -XR) 150 MG 24 hr capsule only. Pt also states she use to take one of the 75 mg and one of the 105 MG to make the 225 or three  of the 75 mg. Please inform he pt what she should do and send in any needed prescriptions. Please leave a voicemail and or mychart message.

## 2024-08-21 ENCOUNTER — Other Ambulatory Visit: Payer: Self-pay

## 2024-08-21 DIAGNOSIS — F410 Panic disorder [episodic paroxysmal anxiety] without agoraphobia: Secondary | ICD-10-CM

## 2024-08-21 DIAGNOSIS — F411 Generalized anxiety disorder: Secondary | ICD-10-CM

## 2024-08-21 MED ORDER — VENLAFAXINE HCL ER 75 MG PO CP24: 225.0000 mg | ORAL_CAPSULE | Freq: Every day | ORAL | 1 refills | Status: AC

## 2024-08-28 ENCOUNTER — Other Ambulatory Visit: Payer: Self-pay | Admitting: Family Medicine

## 2024-08-28 DIAGNOSIS — J40 Bronchitis, not specified as acute or chronic: Secondary | ICD-10-CM

## 2024-09-29 ENCOUNTER — Telehealth: Payer: Self-pay | Admitting: Family Medicine

## 2024-09-29 ENCOUNTER — Other Ambulatory Visit: Payer: Self-pay

## 2024-09-29 ENCOUNTER — Other Ambulatory Visit: Payer: Self-pay | Admitting: Family Medicine

## 2024-09-29 DIAGNOSIS — F17200 Nicotine dependence, unspecified, uncomplicated: Secondary | ICD-10-CM

## 2024-09-29 DIAGNOSIS — J40 Bronchitis, not specified as acute or chronic: Secondary | ICD-10-CM

## 2024-09-29 MED ORDER — LEVOCETIRIZINE DIHYDROCHLORIDE 5 MG PO TABS: ORAL_TABLET | ORAL | 2 refills | Status: AC

## 2024-09-29 NOTE — Telephone Encounter (Unsigned)
 Copied from CRM [id number]. Topic: Clinical - Medication Refill >> Sep 29, 2024  1:54 PM Donna BRAVO wrote: Patient has severe allergies and would like this refilled  Medication: levocetirizine (XYZAL ) 5 MG tablet [[id number]] DISCONTINUED  Has the patient contacted their pharmacy? Yes Pharmacy was at lunch  This is the patient's preferred pharmacy[facility]   [facility] [address] [place], [place] [address]  Phone [phone]  Is this the correct pharmacy for this prescription? Yes If no, delete pharmacy and type the correct one.   Has the prescription been filled recently? No  Is the patient out of the medication? Yes  Has the patient been seen for an appointment in the last year OR does the patient have an upcoming appointment? Yes  Can we respond through MyChart? Yes  Agent: Please be advised that Rx refills may take up to 3 business days. We ask that you follow-up with your pharmacy.

## 2024-09-29 NOTE — Telephone Encounter (Signed)
 Requesting medication not on profile, Xyzal , 5mg 

## 2024-10-01 ENCOUNTER — Other Ambulatory Visit: Payer: Self-pay | Admitting: Family Medicine

## 2024-10-01 DIAGNOSIS — J40 Bronchitis, not specified as acute or chronic: Secondary | ICD-10-CM

## 2024-10-01 NOTE — Telephone Encounter (Signed)
 Copied from CRM [id number]. Topic: Clinical - Medication Refill >> Oct 01, 2024 10:23 AM Dedra B wrote: Medication: clonazePAM  (KLONOPIN ) 0.5 MG tablet  Has the patient contacted their pharmacy? Yes, told to contact office   This is the patient's preferred pharmacy:  [id number] [place] [place] - [address] AT [facility] [facility] [address] [place] [address] Phone: [phone] Fax: [phone]  Is this the correct pharmacy for this prescription? Yes  Has the prescription been filled recently? No  Is the patient out of the medication? No  Has the patient been seen for an appointment in the last year OR does the patient have an upcoming appointment? Yes  Can we respond through MyChart? Yes  Agent: Please be advised that Rx refills may take up to 3 business days. We ask that you follow-up with your pharmacy.

## 2024-10-03 ENCOUNTER — Other Ambulatory Visit: Payer: Self-pay | Admitting: Family Medicine

## 2024-10-03 ENCOUNTER — Other Ambulatory Visit: Payer: Self-pay

## 2024-10-03 DIAGNOSIS — F410 Panic disorder [episodic paroxysmal anxiety] without agoraphobia: Secondary | ICD-10-CM

## 2024-10-03 DIAGNOSIS — J40 Bronchitis, not specified as acute or chronic: Secondary | ICD-10-CM

## 2024-10-03 DIAGNOSIS — I10 Essential (primary) hypertension: Secondary | ICD-10-CM

## 2024-10-03 MED ORDER — CLONAZEPAM 0.5 MG PO TABS: 0.5000 mg | ORAL_TABLET | Freq: Three times a day (TID) | ORAL | 0 refills | Status: DC | PRN

## 2024-10-03 NOTE — Telephone Encounter (Signed)
 Requested medications are due for refill today. no  Requested medications are on the active medications list.  yes  Last refill. 09/29/2024  Future visit scheduled.   yes  Notes to clinic.  Refusal not delegated.    Requested Prescriptions  Pending Prescriptions Disp Refills   clonazePAM  (KLONOPIN ) 0.5 MG tablet 90 tablet 0    Sig: Take 1 tablet (0.5 mg total) by mouth 3 (three) times daily as needed for anxiety.     Not Delegated - Psychiatry: Anxiolytics/Hypnotics 2 Failed - 10/03/2024 10:47 AM      Failed - This refill cannot be delegated      Failed - Urine Drug Screen completed in last 360 days      Failed - Valid encounter within last 6 months    Recent Outpatient Visits           6 months ago GAD (generalized anxiety disorder)   [place] [facility] Medicine Pickard, Butler DASEN, MD   1 year ago Right sided sciatica   [place] [facility] Medicine Duanne Butler DASEN, MD   1 year ago Pure hypercholesterolemia   [place] [facility] Medicine Duanne Butler DASEN, MD   1 year ago Traumatic hematoma of left forearm, subsequent encounter   [place] Medicine Duanne Butler DASEN, MD   1 year ago Bronchitis   [place] [facility] Medicine Pickard, Butler DASEN, MD              Passed - Patient is not pregnant

## 2024-10-03 NOTE — Telephone Encounter (Signed)
 Copied from CRM [id number]. Topic: Clinical - Prescription Issue >> Oct 03, 2024  3:43 PM Dedra B wrote: Reason for CRM: Pt needs [facility]epam  [facility] [address] [facility] [address]. Pt will be out meds today. Pls notify pt when it has been switched.

## 2024-10-03 NOTE — Telephone Encounter (Signed)
 Pt called back for status update, says she only has enough for one day left. Please advise   [id number] [place] [place] - [address] AT [facility] [address] [place] [address] Phone: [phone] Fax: [phone]

## 2024-10-03 NOTE — Telephone Encounter (Signed)
 Requested medication (s) are due for refill today: Yes  Requested medication (s) are on the active medication list: Yes  Last refill:  09/29/24  Future visit scheduled: Yes  Notes to clinic:  Unable to refill per protocol, cannot delegate. Sent to the wrong pharmacy, resend, patient checking on status, almost out of med.      Requested Prescriptions  Pending Prescriptions Disp Refills   clonazePAM  (KLONOPIN ) 0.5 MG tablet 90 tablet 0    Sig: Take 1 tablet (0.5 mg total) by mouth 3 (three) times daily as needed for anxiety.     Not Delegated - Psychiatry: Anxiolytics/Hypnotics 2 Failed - 10/03/2024 11:26 AM      Failed - This refill cannot be delegated      Failed - Urine Drug Screen completed in last 360 days      Failed - Valid encounter within last 6 months    Recent Outpatient Visits           6 months ago GAD (generalized anxiety disorder)   [place] [facility] Medicine Pickard, Butler DASEN, MD   1 year ago Right sided sciatica   [place] [facility] Medicine Duanne Butler DASEN, MD   1 year ago Pure hypercholesterolemia   [place] [facility] Medicine Duanne Butler DASEN, MD   1 year ago Traumatic hematoma of left forearm, subsequent encounter   [place] [facility] Medicine Duanne Butler DASEN, MD   1 year ago Bronchitis    [facility] Medicine Pickard, Butler DASEN, MD              Passed - Patient is not pregnant

## 2024-11-04 ENCOUNTER — Other Ambulatory Visit: Payer: Self-pay | Admitting: Family Medicine

## 2024-11-04 DIAGNOSIS — J40 Bronchitis, not specified as acute or chronic: Secondary | ICD-10-CM

## 2024-11-07 NOTE — Telephone Encounter (Signed)
 Requested medications are due for refill today.  yes  Requested medications are on the active medications list.  yes  Last refill. 10/03/2024 #90 0 rf  Future visit scheduled.   yes  Notes to clinic.  Refill not delegated    Requested Prescriptions  Pending Prescriptions Disp Refills   clonazePAM  (KLONOPIN ) 0.5 MG tablet [Pharmacy Med Name: CLONAZEPAM  0.5 MG TABLET] 90 tablet 0    Sig: Take 1 tablet (0.5 mg total) by mouth 3 (three) times daily as needed for anxiety.     Not Delegated - Psychiatry: Anxiolytics/Hypnotics 2 Failed - 11/07/2024 11:23 AM      Failed - This refill cannot be delegated      Failed - Urine Drug Screen completed in last 360 days      Failed - Valid encounter within last 6 months    Recent Outpatient Visits           7 months ago GAD (generalized anxiety disorder)   [place] [facility] Pickard, Butler DASEN, MD   1 year ago Right sided sciatica   [place] [facility] Duanne Butler DASEN, MD   1 year ago Pure hypercholesterolemia   [place] [facility] Medicine Duanne Butler DASEN, MD   2 years ago Traumatic hematoma of left forearm, subsequent encounter   [place] [facility] Medicine Duanne Butler DASEN, MD   2 years ago Bronchitis   [place] [facility] Medicine Pickard, Butler DASEN, MD              Passed - Patient is not pregnant

## 2024-11-15 ENCOUNTER — Ambulatory Visit (HOSPITAL_COMMUNITY): Admission: EM | Admit: 2024-11-15 | Discharge: 2024-11-15 | Disposition: A | Discharge status: Home / Self Care

## 2024-11-15 ENCOUNTER — Encounter (HOSPITAL_COMMUNITY): Payer: Self-pay

## 2024-11-15 DIAGNOSIS — M5441 Lumbago with sciatica, right side: Secondary | ICD-10-CM

## 2024-11-15 MED ORDER — METHYLPREDNISOLONE 4 MG PO TBPK: ORAL_TABLET | ORAL | 0 refills | Status: AC

## 2024-11-15 MED ORDER — IBUPROFEN 600 MG PO TABS: 600.0000 mg | ORAL_TABLET | Freq: Four times a day (QID) | ORAL | 0 refills | Status: AC | PRN

## 2024-11-15 MED ORDER — METHOCARBAMOL 750 MG PO TABS: 750.0000 mg | ORAL_TABLET | Freq: Four times a day (QID) | ORAL | 0 refills | Status: AC

## 2024-11-15 NOTE — ED Provider Notes (Signed)
 [facility]    CSN: [id number] Arrival date & time: 11/15/24  1233      History   Chief Complaint Chief Complaint  Patient presents with   Fall   Back Pain    HPI Elaine Johns is a 48 y.o. female.   Elaine Johns is a 48 year old female with a history of sciatica who presents today with complaint of right sided low back pain.  The pain began 2 days ago when she fell backwards off of the barstool.  She reports that she landed on the right side of her back and managed to avoid any bony prominences.  She reports that she has had pain since this time that has been gradually worsening.  She reports that she has 8 out of 10 tight, shooting, stabbing pain in her right lower back that does shoot down to her right thigh.  She notes that the back pain is worse with bending, worse in the morning, and worse with some left leg movements.  She reports that she was unable to find a comfortable position last night.  She has attempted heat, ice, and Goody's powder without relief.  She denies numbness, tingling, and weakness of the lower extremities, numbness and tingling around her buttocks/genitalia, as well as bladder and bowel incontinence.  She states this feels like episodes of sciatica that she has had in the past which was successfully relieved with steroids, muscle relaxer, and NSAIDs  The history is provided by the patient.  Fall This is a new problem. The current episode started 2 days ago. The problem occurs constantly. The problem has been gradually worsening. Pertinent negatives include no chest pain, no abdominal pain, no headaches and no shortness of breath. The symptoms are aggravated by bending and twisting. Nothing relieves the symptoms. She has tried a cold compress, a warm compress and rest for the symptoms.  Back Pain Location:  Lumbar spine Quality:  Cramping, shooting and stabbing Radiates to:  R thigh Pain severity:  Severe Pain is:  Worse during the night Ineffective  treatments:  Cold packs, NSAIDs and heating pad Associated symptoms: no abdominal pain, no chest pain, no headaches, no numbness and no weakness     Past Medical History:  Diagnosis Date   Abnormal Pap smear and cervical HPV (human papillomavirus)    Anemia    [employer] 12/29/2020   Anxiety    [employer] 12/29/2020   History of miscarriage    Hyperlipidemia    Hypertension    Panic disorder     Patient Active Problem List   Diagnosis Date Noted   Smoker 07/06/2020   Hypertensive disorder 07/06/2020   Hypercholesterolemia 07/06/2020   Anxiety 07/06/2020   Vitamin D  deficiency 09/24/2013   Panic disorder     Past Surgical History:  Procedure Laterality Date   FRACTURE SURGERY N/A    [employer] 12/29/2020   TUBAL LIGATION      OB History   No obstetric history on file.      Home Medications    Prior to Admission medications   Medication Sig Start Date End Date Taking? Authorizing Provider  ibuprofen  (ADVIL ) 600 MG tablet Take 1 tablet (600 mg total) by mouth every 6 (six) hours as needed. 11/15/24  Yes Leatrice Vernell HERO, NP  methocarbamol  (ROBAXIN ) 750 MG tablet Take 1 tablet (750 mg total) by mouth 4 (four) times daily. 11/15/24  Yes Leatrice Vernell HERO, NP  methylPREDNISolone (MEDROL DOSEPAK) 4 MG TBPK tablet Take 6 pills on day  1, 2 pills on day 2, decreasing by one pill daily until complete 11/15/24  Yes Leatrice Vernell HERO, NP  Aspirin-Acetaminophen -Caffeine (GOODYS EXTRA STRENGTH) 500-325-65 MG PACK Take 1 packet by mouth 2 (two) times daily as needed (Pain).    [provider]  Cholecalciferol (VITAMIN D ) 2000 UNITS CAPS Take 1 capsule by mouth daily.    [provider]  clonazePAM  (KLONOPIN ) 0.5 MG tablet TAKE 1 TABLET (0.5 MG TOTAL) BY MOUTH 3 (THREE) TIMES DAILY AS NEEDED FOR ANXIETY. 11/07/24   Duanne Butler DASEN, MD  levocetirizine (XYZAL ) 5 MG tablet TAKE 1 TABLET BY MOUTH EVERY DAY IN THE EVENING 09/29/24   Duanne Butler DASEN, MD  losartan   (COZAAR ) 50 MG tablet TAKE 1 TABLET BY MOUTH EVERY DAY 10/03/24   Duanne Butler DASEN, MD  venlafaxine  XR (EFFEXOR  XR) 75 MG 24 hr capsule Take 3 capsules (225 mg total) by mouth daily with breakfast. 08/21/24   Duanne Butler DASEN, MD    Family History History reviewed. No pertinent family history.  Social History Social History   Tobacco Use   Smoking status: Former    Current packs/day: 1.00    Types: Cigarettes   Smokeless tobacco: Never  Vaping Use   Vaping status: Every Day   Substances: Nicotine, Flavoring  Substance Use Topics   Alcohol use: Yes   Drug use: No     Allergies   Antihistamines, chlorpheniramine-type   Review of Systems Review of Systems  Constitutional: Negative.   Respiratory:  Negative for shortness of breath.   Cardiovascular:  Negative for chest pain.  Gastrointestinal:  Negative for abdominal pain.  Musculoskeletal:  Positive for back pain and myalgias. Negative for arthralgias, joint swelling, neck pain and neck stiffness.  Skin:  Negative for color change, pallor, rash and wound.  Neurological:  Negative for weakness, numbness and headaches.     Physical Exam Triage Vital Signs ED Triage Vitals  Encounter Vitals Group     BP 11/15/24 1344 (!) 150/92     Girls Systolic BP Percentile --      Girls Diastolic BP Percentile --      Boys Systolic BP Percentile --      Boys Diastolic BP Percentile --      Pulse Rate 11/15/24 1344 96     Resp 11/15/24 1344 14     Temp 11/15/24 1344 98.4 F (36.9 C)     Temp Source 11/15/24 1344 Oral     SpO2 11/15/24 1344 98 %     Weight --      Height --      Head Circumference --      Peak Flow --      Pain Score 11/15/24 1343 8     Pain Loc --      Pain Education --      Exclude from Growth Chart --    No data found.  Updated Vital Signs BP (!) 150/92 (BP Location: Right Arm)   Pulse 96   Temp 98.4 F (36.9 C) (Oral)   Resp 14   LMP 11/17/2021   SpO2 98%   Visual Acuity Right Eye Distance:    Left Eye Distance:   Bilateral Distance:    Right Eye Near:   Left Eye Near:    Bilateral Near:     Physical Exam Vitals and nursing note reviewed.  Constitutional:      General: She is not in acute distress.    Appearance: Normal appearance. She is  normal weight. She is not toxic-appearing.  Eyes:     Conjunctiva/sclera: Conjunctivae normal.  Cardiovascular:     Rate and Rhythm: Normal rate and regular rhythm.     Heart sounds: Normal heart sounds.  Pulmonary:     Effort: Pulmonary effort is normal.     Breath sounds: Normal breath sounds and air entry.  Musculoskeletal:     Cervical back: Normal.     Thoracic back: Normal.     Lumbar back: Spasms and tenderness present. No bony tenderness. Decreased range of motion (Flexion guarded due to pain. Otherwise WNL). Positive left straight leg raise test. Negative right straight leg raise test.       Back:  Lymphadenopathy:     Cervical:     Right cervical: No posterior cervical adenopathy.    Left cervical: No posterior cervical adenopathy.  Skin:    General: Skin is warm and dry.  Neurological:     Mental Status: She is alert and oriented to person, place, and time.     Motor: Motor function is intact.     Gait: Gait is intact.  Psychiatric:        Mood and Affect: Mood normal.        Behavior: Behavior normal.      UC Treatments / Results  Labs (all labs ordered are listed, but only abnormal results are displayed) Labs Reviewed - No data to display  EKG   Radiology No results found.  Procedures Procedures (including critical care time)  Medications Ordered in UC Medications - No data to display  Initial Impression / Assessment and Plan / UC Course  I have reviewed the triage vital signs and the nursing notes.  Pertinent labs & imaging results that were available during my care of the patient were reviewed by me and considered in my medical decision making (see chart for details).     No bony  tenderness present on exam.  Patient would like to defer x-ray at this time.  Will treat sciatica with Pred pack and muscle spasms with methocarbamol .  Also adding in ibuprofen  600 every 6 hours and encouraged gentle activity.  Patient reports that she has a very active job, so I provided a work note for 3 days.  She is aware of red flag symptoms and will seek medical attention should these occur Final Clinical Impressions(s) / UC Diagnoses   Final diagnoses:  Acute right-sided low back pain with right-sided sciatica     Discharge Instructions       - Muscle Spasms: These are involuntary contractions of the muscles in your lower back, often causing pain and stiffness. - Sciatica: This refers to pain, tingling, or numbness that radiates from your lower back down one or both legs, typically due to irritation or compression of the sciatic nerve.  Management Strategies  1. Medications  - NSAIDs (Ibuprofen  600 mg every 6 hours as needed): Reduces pain and inflammation. Take with food to protect your stomach.  - Methocarbamol : A muscle relaxant to help relieve muscle spasms. May cause drowsiness--use caution with activities requiring alertness.  - Lidocaine Patch: Apply to the most painful area for local pain relief. Use as directed, and avoid applying to broken skin.  - Methylprednisolone Dose Pack: Take as prescribed--6 pills on day 1, 5 on day 2, 4 on day 3, 3 on day 4, 2 on day 5, and 1 on day 6. This short course of steroids helps reduce nerve inflammation and swelling, which can  relieve sciatica symptoms. Take the medication in the morning with food.  2. Heat Therapy - Apply a heating pad or warm compress to your lower back for 15-20 minutes at a time, several times a day. Heat helps relax tight muscles and can ease spasms.  3. Activity and Movement - Stay Active: Gentle movement and daily activities are encouraged. Prolonged bed rest can worsen stiffness and delay recovery. -  Walking: Short, frequent walks can help reduce nerve irritation and muscle tightness. - Avoid Heavy Lifting: Do not lift heavy objects or twist your back while lifting.  4. Stretching and Exercise - Gentle Stretching: Focus on stretches that target the lower back, hips, and legs. For sciatica, stretches like the piriformis stretch, hamstring stretch, and knee-to-chest can be helpful. - Strengthening: As pain improves, core and lower back strengthening exercises can help prevent future episodes. - Physical Therapy: If symptoms persist, a physical therapist can guide you through safe, effective exercises.  5. Posture and Ergonomics - Sitting: Use a chair with good lumbar support. Keep your feet flat on the floor and avoid slouching. - Standing: Stand upright with your weight balanced on both feet. - Lifting: Bend at your knees and hips, not your waist. Hold objects close to your body.  6. Supportive Measures - Sleep: Try to sleep on your side with a pillow between your knees, or on your back with a pillow under your knees to reduce pressure on the sciatic nerve. - Weight Management: Maintaining a healthy weight reduces strain on your back and nerves. - Stress Reduction: Stress can increase muscle tension. Consider relaxation techniques such as deep breathing, meditation, or gentle yoga.  What to Expect - Most people with muscle spasms and sciatica experience significant improvement within 2-4 weeks with conservative care. - The steroid dose pack may provide more rapid relief of nerve-related symptoms. - Some discomfort may persist, but gradual improvement is expected.  When to Seek Medical Attention  Contact your healthcare provider promptly if you experience: - New or worsening numbness, tingling, or weakness in your legs - Loss of bowel or bladder control - Severe, unrelenting pain not improved with rest or medication - Fever, unexplained weight loss, or history of cancer - Pain after a  significant injury (such as a fall or accident)  Prevention Tips - Stay physically active and incorporate regular stretching and strengthening exercises. - Practice good posture and body mechanics. - Avoid prolonged sitting or standing. - Use proper lifting techniques.      ED Prescriptions     Medication Sig Dispense Auth. Provider   methylPREDNISolone (MEDROL DOSEPAK) 4 MG TBPK tablet Take 6 pills on day 1, 2 pills on day 2, decreasing by one pill daily until complete 21 tablet Leatrice Vernell HERO, NP   methocarbamol  (ROBAXIN ) 750 MG tablet Take 1 tablet (750 mg total) by mouth 4 (four) times daily. 28 tablet Leatrice Vernell HERO, NP   ibuprofen  (ADVIL ) 600 MG tablet Take 1 tablet (600 mg total) by mouth every 6 (six) hours as needed. 30 tablet Leatrice Vernell HERO, NP      PDMP not reviewed this encounter.   Leatrice Vernell HERO, NP 11/15/24 1418

## 2024-11-15 NOTE — Discharge Instructions (Signed)
-   Muscle Spasms: These are involuntary contractions of the muscles in your lower back, often causing pain and stiffness. - Sciatica: This refers to pain, tingling, or numbness that radiates from your lower back down one or both legs, typically due to irritation or compression of the sciatic nerve.  Management Strategies  1. Medications  - NSAIDs (Ibuprofen  600 mg every 6 hours as needed): Reduces pain and inflammation. Take with food to protect your stomach.  - Methocarbamol : A muscle relaxant to help relieve muscle spasms. May cause drowsiness--use caution with activities requiring alertness.  - Lidocaine Patch: Apply to the most painful area for local pain relief. Use as directed, and avoid applying to broken skin.  - Methylprednisolone Dose Pack: Take as prescribed--6 pills on day 1, 5 on day 2, 4 on day 3, 3 on day 4, 2 on day 5, and 1 on day 6. This short course of steroids helps reduce nerve inflammation and swelling, which can relieve sciatica symptoms. Take the medication in the morning with food.  2. Heat Therapy - Apply a heating pad or warm compress to your lower back for 15-20 minutes at a time, several times a day. Heat helps relax tight muscles and can ease spasms.  3. Activity and Movement - Stay Active: Gentle movement and daily activities are encouraged. Prolonged bed rest can worsen stiffness and delay recovery. - Walking: Short, frequent walks can help reduce nerve irritation and muscle tightness. - Avoid Heavy Lifting: Do not lift heavy objects or twist your back while lifting.  4. Stretching and Exercise - Gentle Stretching: Focus on stretches that target the lower back, hips, and legs. For sciatica, stretches like the piriformis stretch, hamstring stretch, and knee-to-chest can be helpful. - Strengthening: As pain improves, core and lower back strengthening exercises can help prevent future episodes. - Physical Therapy: If symptoms persist, a physical therapist can  guide you through safe, effective exercises.  5. Posture and Ergonomics - Sitting: Use a chair with good lumbar support. Keep your feet flat on the floor and avoid slouching. - Standing: Stand upright with your weight balanced on both feet. - Lifting: Bend at your knees and hips, not your waist. Hold objects close to your body.  6. Supportive Measures - Sleep: Try to sleep on your side with a pillow between your knees, or on your back with a pillow under your knees to reduce pressure on the sciatic nerve. - Weight Management: Maintaining a healthy weight reduces strain on your back and nerves. - Stress Reduction: Stress can increase muscle tension. Consider relaxation techniques such as deep breathing, meditation, or gentle yoga.  What to Expect - Most people with muscle spasms and sciatica experience significant improvement within 2-4 weeks with conservative care. - The steroid dose pack may provide more rapid relief of nerve-related symptoms. - Some discomfort may persist, but gradual improvement is expected.  When to Seek Medical Attention  Contact your healthcare provider promptly if you experience: - New or worsening numbness, tingling, or weakness in your legs - Loss of bowel or bladder control - Severe, unrelenting pain not improved with rest or medication - Fever, unexplained weight loss, or history of cancer - Pain after a significant injury (such as a fall or accident)  Prevention Tips - Stay physically active and incorporate regular stretching and strengthening exercises. - Practice good posture and body mechanics. - Avoid prolonged sitting or standing. - Use proper lifting techniques.

## 2024-11-15 NOTE — ED Triage Notes (Signed)
 Patient states she was pushing her stool away from the table 2 days ago and fell backwards, landing on the right lower back. Patient states the pain radiates to the buttocks and down to the mid thigh area.  Patient did nt hit her head.  Patient states she has been taking Goody's powder for her pain.

## 2024-11-17 ENCOUNTER — Telehealth: Admitting: Emergency Medicine

## 2024-11-17 ENCOUNTER — Ambulatory Visit: Payer: Self-pay

## 2024-11-17 DIAGNOSIS — M5441 Lumbago with sciatica, right side: Secondary | ICD-10-CM | POA: Diagnosis not present

## 2024-11-17 NOTE — Patient Instructions (Signed)
  Elaine Johns, thank you for joining Jon CHRISTELLA Belt, NP for today's virtual visit.  While this provider is not your primary care provider (PCP), if your PCP is located in our provider database this encounter information will be shared with them immediately following your visit.   A [facility] MyChart account gives you access to today's visit and all your visits, tests, and labs performed at [facility]  click here if you don't have a [facility] MyChart account or go to [email or web address]  Consent: (Patient) Elaine Johns provided verbal consent for this virtual visit at the beginning of the encounter.  Current Medications:  Current Outpatient Medications:    Aspirin-Acetaminophen -Caffeine (GOODYS EXTRA STRENGTH) 500-325-65 MG PACK, Take 1 packet by mouth 2 (two) times daily as needed (Pain)., Disp: , Rfl:    Cholecalciferol (VITAMIN D ) 2000 UNITS CAPS, Take 1 capsule by mouth daily., Disp: , Rfl:    clonazePAM  (KLONOPIN ) 0.5 MG tablet, TAKE 1 TABLET (0.5 MG TOTAL) BY MOUTH 3 (THREE) TIMES DAILY AS NEEDED FOR ANXIETY., Disp: 90 tablet, Rfl: 0   ibuprofen  (ADVIL ) 600 MG tablet, Take 1 tablet (600 mg total) by mouth every 6 (six) hours as needed., Disp: 30 tablet, Rfl: 0   levocetirizine (XYZAL ) 5 MG tablet, TAKE 1 TABLET BY MOUTH EVERY DAY IN THE EVENING, Disp: 90 tablet, Rfl: 2   losartan  (COZAAR ) 50 MG tablet, TAKE 1 TABLET BY MOUTH EVERY DAY, Disp: 90 tablet, Rfl: 1   methocarbamol  (ROBAXIN ) 750 MG tablet, Take 1 tablet (750 mg total) by mouth 4 (four) times daily., Disp: 28 tablet, Rfl: 0   methylPREDNISolone (MEDROL DOSEPAK) 4 MG TBPK tablet, Take 6 pills on day 1, 2 pills on day 2, decreasing by one pill daily until complete, Disp: 21 tablet, Rfl: 0   venlafaxine  XR (EFFEXOR  XR) 75 MG 24 hr capsule, Take 3 capsules (225 mg total) by mouth daily with breakfast., Disp: 270 capsule, Rfl: 1   Medications ordered in this encounter:  No orders of the defined types  were placed in this encounter.    *If you need refills on other medications prior to your next appointment, please contact your pharmacy*  Follow-Up: Call back or seek an in-person evaluation if the symptoms worsen or if the condition fails to improve as anticipated.  [facility] [phone]  Other Instructions  Please follow up with Dr. Duanne or sports medicine clinic within a week for further evaluation of your pain and sciatica.    If you have been instructed to have an in-person evaluation today at a local Urgent Care facility, please use the link below. It will take you to a list of all of our available [facility], including address, phone number and hours of operation. Please do not delay care.  [facility]  If you or a family member do not have a primary care provider, use the link below to schedule a visit and establish care. When you choose a [facility] primary care physician or advanced practice provider, you gain a long-term partner in health. Find a Primary Care Provider  Learn more about [facility]'s in-office and virtual care options: [facility] - Get Care Now

## 2024-11-17 NOTE — Telephone Encounter (Signed)
 FYI Only or Action Required?: FYI only for provider: No appointments available today, scheduled virtual care visit this evening versus in office tomorrow due to time sensitive nature/ patient needs.  Patient was last seen in primary care on 03/14/2024 by Duanne Butler DASEN, MD.  Called Nurse Triage reporting Fall (Fall on 11/27, symptoms unchanged. ).  Symptoms began 11/13/24.  Interventions attempted: For pain after fall: OTC medications: Ibuprofen , Prescription medications: Prednisone , Muscle Relaxer, Rest, hydration, or home remedies, and Ice/heat application. Stretches, walking for sciatica.   Symptoms are: gradually improving, but concern for re-injury or setback tomorrow upon return to work.  Triage Disposition: See PCP When Office is Open (Within 3 Days).  Patient preferred virtual visit this evening versus in office tomorrow.   Patient/caregiver understands and will follow disposition?: Yes     Copied from CRM #[id number]. Topic: Clinical - Red Word Triage >> Nov 17, 2024  1:52 PM Fonda T wrote: Red Word that prompted transfer to Nurse Triage: Pt calling, reports she fell on Thursday, 11/13/24, and seen at urgent care on Saturday 11/15/24, at which time prescribed prednisone , ibuprofen , and muscle relaxer.  States she has lower back pain and soreness from the fall, landing on lower back right hand side, above buttocks area, is painful to touch.   Pt reports symptoms have not gotten any better, would like to be seen for an appt for evaluation. Reason for Disposition  MILD weakness (e.g., does not interfere with ability to work, go to school, normal activities)  (Exception: Mild weakness is a chronic symptom.)  Answer Assessment - Initial Assessment Questions 1. MECHANISM: How did the fall happen?     Was pushing stool out away from kitchen counter, stool stopped sliding, stool and patient both went back hitting floor.  Denies hitting head when falling.  Hit mostly the right side of  back.   2. DOMESTIC VIOLENCE AND ELDER ABUSE SCREENING: Did you fall because someone pushed you or tried to hurt you? If Yes, ask: Are you safe now?     Deferred, fell back while pushing back kitchen stool.   3. ONSET: When did the fall happen? (e.g., minutes, hours, or days ago)     Thursday, 11/27;  Pain onset after fall, reports hx of sciatica; feels like sciatica but different, pain does not run down leg like her typical sciatica thus relates more to fall.   4. LOCATION: What part of the body hit the ground? (e.g., back, buttocks, head, hips, knees, hands, head, stomach)     Low back and right side.   5. INJURY: Did you hurt (injure) yourself when you fell? If Yes, ask: What did you injure? Tell me more about this? (e.g., body area; type of injury; pain severity)     Low back pain, rib soreness, neck soreness; no imaging done at urgent care where she was evaluated after fall (11/29 per chart).    6. PAIN: Is there any pain? If Yes, ask: How bad is the pain? (e.g., Scale 0-10; or none, mild,       Low back pain rated 2/10 now, 10/10 in the morning upon waking.  Takes prednisone , not yet finished with. Taking 600 mg ibuprofen  and muscle relaxer.  Walking and stretching for sciatica.    Rib cage (front and back near bra strap) pain not bad, just sore with deep breaths and touch. I don't think they're broken, just maybe bruised.  Denies trouble breathing.   Neck pain sore, has had grinding in  neck for last couple years.  States I wouldn't even rate pain.  Denies any numbness or tingling to extremities.    Patient reports worried about work tomorrow at 7 AM with no limitations in place.   Provider's note from Urgent Care states she can return to work tomorrow with no restrictions however patient reports there is no way can lift or climb ladders. Does not want to re-injure anything but still wants to be able to attend work.    7. SIZE: For cuts, bruises, or swelling,  ask: How large is it? (e.g., inches or centimeters)      Unknown if any brusing or swelling as patient reports unable to see based on location, reports feels tender to touch, Urgent Care provider stated no bruising or swelling per patient.   8. PREGNANCY: Is there any chance you are pregnant? When was your last menstrual period?     Last menstrual period 2 years ago.   9. OTHER SYMPTOMS: Do you have any other symptoms? (e.g., dizziness, fever, weakness; new-onset or worsening).      Reports feeling weak in the morning upon waking but improves throughout the day.  Reports overall symptoms have generally improved and treatments have been effective but does not want to risk further injury or any decline upon return to work tomorrow at 7 AM.   10. CAUSE: What do you think caused the fall (or falling)? (e.g., dizzy spell, tripped)       Chair stopped sliding and patient and chair fell back instead.  Protocols used: Falls and [facility]

## 2024-11-17 NOTE — Progress Notes (Signed)
 Virtual Visit Consent   Elaine Johns, you are scheduled for a virtual visit with a [facility] provider today. Just as with appointments in the office, your consent must be obtained to participate. Your consent will be active for this visit and any virtual visit you may have with one of our providers in the next 365 days. If you have a MyChart account, a copy of this consent can be sent to you electronically.  As this is a virtual visit, video technology does not allow for your provider to perform a traditional examination. This may limit your provider's ability to fully assess your condition. If your provider identifies any concerns that need to be evaluated in person or the need to arrange testing (such as labs, EKG, etc.), we will make arrangements to do so. Although advances in technology are sophisticated, we cannot ensure that it will always work on either your end or our end. If the connection with a video visit is poor, the visit may have to be switched to a telephone visit. With either a video or telephone visit, we are not always able to ensure that we have a secure connection.  By engaging in this virtual visit, you consent to the provision of healthcare and authorize for your insurance to be billed (if applicable) for the services provided during this visit. Depending on your insurance coverage, you may receive a charge related to this service.  I need to obtain your verbal consent now. Are you willing to proceed with your visit today? Elaine Johns has provided verbal consent on 11/17/2024 for a virtual visit (video or telephone). Jon CHRISTELLA Belt, NP  Date: 11/17/2024 4:53 PM   Virtual Visit via Video Note   I, Jon CHRISTELLA Belt, connected with  Elaine Johns  ([id number], 1976-03-11) on 11/17/24 at  4:45 PM EST by a video-enabled telemedicine application and verified that I am speaking with the correct person using two identifiers.  Location: Patient: Virtual Visit Location  Patient: Home Provider: Virtual Visit Location Provider: Home Office   I discussed the limitations of evaluation and management by telemedicine and the availability of in person appointments. The patient expressed understanding and agreed to proceed.    History of Present Illness: Elaine Johns is a 48 y.o. who identifies as a female who was assigned female at birth, and is being seen today for sciatica. WAs seen at Urgent Care on 11/15/24 for same, feels a little a better, but does not feel she can go back to physical job yet without some restrictions. However, feels strongly she cannot afford to miss work. Job entails climbing ladders, bending, lifting/pushing heavy objects. Requests light duty work note.    HPI: HPI  Problems:  Patient Active Problem List   Diagnosis Date Noted   Smoker 07/06/2020   Hypertensive disorder 07/06/2020   Hypercholesterolemia 07/06/2020   Anxiety 07/06/2020   Vitamin D  deficiency 09/24/2013   Panic disorder     Allergies:  Allergies  Allergen Reactions   Antihistamines, Chlorpheniramine-Type     Raised heart rate.   Medications:  Current Outpatient Medications:    Aspirin-Acetaminophen -Caffeine (GOODYS EXTRA STRENGTH) 500-325-65 MG PACK, Take 1 packet by mouth 2 (two) times daily as needed (Pain)., Disp: , Rfl:    Cholecalciferol (VITAMIN D ) 2000 UNITS CAPS, Take 1 capsule by mouth daily., Disp: , Rfl:    clonazePAM  (KLONOPIN ) 0.5 MG tablet, TAKE 1 TABLET (0.5 MG TOTAL) BY MOUTH 3 (THREE) TIMES DAILY AS NEEDED FOR ANXIETY.,  Disp: 90 tablet, Rfl: 0   ibuprofen  (ADVIL ) 600 MG tablet, Take 1 tablet (600 mg total) by mouth every 6 (six) hours as needed., Disp: 30 tablet, Rfl: 0   levocetirizine (XYZAL ) 5 MG tablet, TAKE 1 TABLET BY MOUTH EVERY DAY IN THE EVENING, Disp: 90 tablet, Rfl: 2   losartan  (COZAAR ) 50 MG tablet, TAKE 1 TABLET BY MOUTH EVERY DAY, Disp: 90 tablet, Rfl: 1   methocarbamol  (ROBAXIN ) 750 MG tablet, Take 1 tablet (750 mg total) by  mouth 4 (four) times daily., Disp: 28 tablet, Rfl: 0   methylPREDNISolone (MEDROL DOSEPAK) 4 MG TBPK tablet, Take 6 pills on day 1, 2 pills on day 2, decreasing by one pill daily until complete, Disp: 21 tablet, Rfl: 0   venlafaxine  XR (EFFEXOR  XR) 75 MG 24 hr capsule, Take 3 capsules (225 mg total) by mouth daily with breakfast., Disp: 270 capsule, Rfl: 1  Observations/Objective: Patient is well-developed, well-nourished in no acute distress.  Resting comfortably  at home.  Head is normocephalic, atraumatic.  No labored breathing.  Speech is clear and coherent with logical content.  Patient is alert and oriented at baseline.    Assessment and Plan: 1. Acute right-sided low back pain with right-sided sciatica (Primary)  Discussed with pt tasks at work she is able to perform and I wrote her a note for these tasks. I think there are few job tasks she can do in her position as she describes them.   She MUST be seen either by pcp or sports medicine within the week for re-eval; she cannot be seen again in virtual urgent care for this problem.   Follow Up Instructions: I discussed the assessment and treatment plan with the patient. The patient was provided an opportunity to ask questions and all were answered. The patient agreed with the plan and demonstrated an understanding of the instructions.  A copy of instructions were sent to the patient via MyChart unless otherwise noted below.    The patient was advised to call back or seek an in-person evaluation if the symptoms worsen or if the condition fails to improve as anticipated.    Jon CHRISTELLA Belt, NP

## 2024-11-18 ENCOUNTER — Ambulatory Visit: Payer: Self-pay

## 2024-11-18 NOTE — Telephone Encounter (Signed)
 FYI Only or Action Required?: Action required by provider: request for appointment.  Patient was last seen in primary care on 11/17/2024 by Richad Jon HERO, NP.  Called Nurse Triage reporting Back Pain.  Symptoms began several days ago.  Interventions attempted:  Ibuprofen  600 mg Oral Every 6 hours PRN  Methocarbamol  750 mg Oral 4 times daily  methylPREDNISolone 4 MG T .  Symptoms are: unchanged.  Triage Disposition: See HCP Within 4 Hours (Or PCP Triage)  Patient/caregiver understands and will follow disposition?: No, wishes to speak with PCP   Copied from CRM #[id number]. Topic: Clinical - Red Word Triage >> Nov 18, 2024  2:43 PM Jasmin G wrote: Red Word that prompted transfer to Nurse Triage: Pt is experiencing severe pain on sciatica, right side. Reason for Disposition  [1] SEVERE back pain (e.g., excruciating, unable to do any normal activities) AND [2] not improved 2 hours after pain medicine  Answer Assessment - Initial Assessment Questions No available appts today. Patient requesting appt with Dr. Duanne tomorrow at any time.  Advised call back or ED if symptoms worsen. Patient verbalized understanding.  1. ONSET: When did the pain begin? (e.g., minutes, hours, days)     11/15/24; FALL, seen in UC 2. LOCATION: Where does it hurt? (upper, mid or lower back)     Lower back radiates to right buttocks, right thigh 3. SEVERITY: How bad is the pain?  (e.g., Scale 1-10; mild, moderate, or severe) 10/10      4. PATTERN: Is the pain constant? (e.g., yes, no; constant, intermittent)      Constant 10/10      8. MEDICINES: What have you taken so far for the pain? (e.g., nothing, acetaminophen , NSAIDS)      Ibuprofen  600 mg Oral Every 6 hours PRN  Methocarbamol  750 mg Oral 4 times daily  methylPREDNISolone 4 MG T  9. NEUROLOGIC SYMPTOMS: Do you have any weakness, numbness, or problems with bowel/bladder control?     no 10. OTHER SYMPTOMS: Do you have any  other symptoms? (e.g., fever, abdomen pain, burning with urination, blood in urine)   Nausea intermittent Deneis fever, chills, vomiting, abd pain, buring urination, blood urine, dizziness  Protocols used: Back Pain-A-AH

## 2024-11-21 ENCOUNTER — Ambulatory Visit: Admitting: Family Medicine

## 2024-11-21 ENCOUNTER — Telehealth: Payer: Self-pay | Admitting: Family Medicine

## 2024-11-21 ENCOUNTER — Encounter: Payer: Self-pay | Admitting: Family Medicine

## 2024-11-21 VITALS — BP 124/62 | HR 83 | Temp 98.0°F | Ht 59.0 in | Wt 114.0 lb

## 2024-11-21 DIAGNOSIS — M25551 Pain in right hip: Secondary | ICD-10-CM

## 2024-11-21 MED ORDER — HYDROCODONE-ACETAMINOPHEN 5-325 MG PO TABS: 1.0000 | ORAL_TABLET | Freq: Four times a day (QID) | ORAL | 0 refills | Status: DC | PRN

## 2024-11-21 NOTE — Telephone Encounter (Signed)
 Copied from CRM [id number]. Topic: Clinical - Medication Prior Auth >> Nov 21, 2024  3:46 PM Tiffini S wrote: Reason for CRM: Patient was seen at office today, prescription was sent to pharmacy for HYDROcodone -acetaminophen  (NORCO/VICODIN) 5-325 MG tablet. Per patient, a prior authorization is needed to pick up the medication- patient is still at the pharmacy.   Called CAL, no answer.    Please call the patient at [phone] for update.

## 2024-11-21 NOTE — Telephone Encounter (Unsigned)
 Copied from CRM [id number]. Topic: Clinical - Medication Prior Auth >> Nov 21, 2024  3:46 PM Tiffini S wrote: Reason for CRM: Patient was seen at office today, prescription was sent to pharmacy for HYDROcodone -acetaminophen  (NORCO/VICODIN) 5-325 MG tablet. Per patient, a prior authorization is needed to pick up the medication- patient is still at the pharmacy.   Called CAL, no answer.    Please call the patient at [phone] for update.

## 2024-11-21 NOTE — Progress Notes (Signed)
 Subjective:    Patient ID: Elaine Johns, female    DOB: [birth year], 48 y.o.   MRN: [id number]  Patient was sitting on a barstool.  She went to push herself back from the table and the barstool tipped over and she fell directly backwards landing on her right gluteus and right posterior hip.  Ever since that time she has severe pain in her right lower back.  The pain is primarily over the iliac crest and the superior portion of the gluteus.  It hurts for her to bend forward more than 20 degrees.  She is unable to bend over and pick up things due to the pain in her lower back.  She denies any pain radiating down her leg.  She denies any numbness or tingling in her right leg.  She denies any weakness in her right leg.  She is exquisitely tender to palpation over the iliac crest. 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     Cholecalciferol (VITAMIN D ) 2000 UNITS CAPS Take 1 capsule by mouth daily.     clonazePAM  (KLONOPIN ) 0.5 MG tablet TAKE 1 TABLET (0.5 MG TOTAL) BY MOUTH 3 (THREE) TIMES DAILY AS NEEDED FOR ANXIETY. 90 tablet 0   ibuprofen  (ADVIL ) 600 MG tablet Take 1 tablet (600 mg total) by mouth every 6 (six) hours as needed. 30 tablet 0   levocetirizine (XYZAL ) 5 MG tablet TAKE 1 TABLET BY MOUTH EVERY DAY IN THE EVENING 90 tablet 2   losartan  (COZAAR ) 50 MG tablet TAKE 1 TABLET BY MOUTH EVERY DAY 90 tablet 1   methocarbamol  (ROBAXIN ) 750 MG  tablet Take 1 tablet (750 mg total) by mouth 4 (four) times daily. 28 tablet 0   venlafaxine  XR (EFFEXOR  XR) 75 MG 24 hr capsule Take 3 capsules (225 mg total) by mouth daily with breakfast. 270 capsule 1   methylPREDNISolone  (MEDROL  DOSEPAK) 4 MG TBPK tablet Take 6 pills on day 1, 2 pills on day 2, decreasing by one pill daily until complete (Patient not taking: Reported on 11/21/2024) 21 tablet 0   No current facility-administered medications on file prior to visit.   Allergies  Allergen Reactions   Antihistamines, Chlorpheniramine-Type     Raised heart rate.   Social History   Socioeconomic History   Marital status: Married    Spouse name: Not on file   Number of children: Not on file   Years of education: Not on file   Highest education level: 12th grade  Occupational History   Not on file  Tobacco Use   Smoking status: Former    Current packs/day: 1.00    Types: Cigarettes   Smokeless tobacco: Never  Vaping Use   Vaping status: Every Day   Substances: Nicotine, Flavoring  Substance and Sexual Activity   Alcohol use: Yes   Drug use: No   Sexual activity: Not on  file  Other Topics Concern   Not on file  Social History Narrative   Not on file   Social Drivers of Health   Financial Resource Strain: Medium Risk (11/19/2024)   Overall Financial Resource Strain (CARDIA)    Difficulty of Paying Living Expenses: Somewhat hard  Food Insecurity: No Food Insecurity (11/19/2024)   Hunger Vital Sign    Worried About Running Out of Food in the Last Year: Never true    Ran Out of Food in the Last Year: Never true  Transportation Needs: No Transportation Needs (11/19/2024)   PRAPARE - Administrator, Civil Service (Medical): No    Lack of Transportation (Non-Medical): No  Physical Activity: Sufficiently Active (11/19/2024)   Exercise Vital Sign    Days of Exercise per Week: 7 days    Minutes of Exercise per Session: 150+ min  Stress: Stress Concern Present (11/19/2024)    [employer] of Occupational Health - Occupational Stress Questionnaire    Feeling of Stress: Very much  Social Connections: Socially Isolated (11/19/2024)   Social Connection and Isolation Panel    Frequency of Communication with Friends and Family: More than three times a week    Frequency of Social Gatherings with Friends and Family: Patient declined    Attends Religious Services: Never    Database Administrator or Organizations: No    Attends Engineer, Structural: Not on file    Marital Status: Divorced  Intimate Partner Violence: Not on file      Review of Systems  All other systems reviewed and are negative.      Objective:   Physical Exam Vitals reviewed.  Constitutional:      General: She is not in acute distress.    Appearance: She is well-developed. She is not diaphoretic.  HENT:     Head: Normocephalic and atraumatic.  Neck:     Thyroid: No thyromegaly.     Vascular: No JVD.     Trachea: No tracheal deviation.  Cardiovascular:     Rate and Rhythm: Normal rate and regular rhythm.     Heart sounds: Normal heart sounds. No murmur heard.    No friction rub. No gallop.  Pulmonary:     Effort: Pulmonary effort is normal. No respiratory distress.     Breath sounds: Normal breath sounds. No stridor. No wheezing or rales.  Chest:     Chest wall: No tenderness.  Genitourinary:    Vagina: Normal.     Cervix: No cervical motion tenderness, friability or erythema.     Uterus: Normal. Not enlarged and not tender.      Adnexa: Right adnexa normal and left adnexa normal.       Right: No mass or tenderness.         Left: No mass or tenderness.    Musculoskeletal:        General: Tenderness present.     Cervical back: Normal range of motion and neck supple.     Lumbar back: Tenderness and bony tenderness present. Decreased range of motion.       Back:       Legs:  Lymphadenopathy:     Cervical: No cervical adenopathy.  Skin:    General: Skin is  warm.     Coloration: Skin is not pale.     Findings: No erythema or rash.  Neurological:     Mental Status: She is alert and oriented to person, place, and time.  Cranial Nerves: No cranial nerve deficit.     Sensory: No sensory deficit.     Motor: No abnormal muscle tone.     Coordination: Coordination normal.     Deep Tendon Reflexes: Reflexes normal.  Psychiatric:        Attention and Perception: Attention and perception normal.        Speech: She is communicative. Speech is not rapid and pressured, slurred or tangential.        Behavior: Behavior normal.        Thought Content: Thought content normal.        Cognition and Memory: Cognition and memory normal.        Judgment: Judgment normal.           Assessment & Plan:  Right hip pain - Plan: DG Hip Unilat W OR W/O Pelvis 2-3 Views Right I believe the patient likely bruised the pelvic bone and her right gluteus.  Recommended an x-ray to rule out any fracture in the pelvis however I feel that this is unlikely.  There is no tenderness to palpation over the spinous processes or transverse processes of the lumbar spine.  Patient is able to return to work on Monday however she is unable to lift more than 20 pounds.  Await the results of the x-ray of the pelvis to rule out any fractures.  The patient continues Norco 5/325 1 p.o. every 6 hours as needed severe pain.  However I encouraged her to try to use ibuprofen  for the most part

## 2024-11-22 ENCOUNTER — Ambulatory Visit (HOSPITAL_COMMUNITY)

## 2024-11-22 ENCOUNTER — Ambulatory Visit (HOSPITAL_COMMUNITY)
Admission: EM | Admit: 2024-11-22 | Discharge: 2024-11-22 | Disposition: A | Discharge status: Home / Self Care | Attending: Emergency Medicine | Admitting: Emergency Medicine

## 2024-11-22 ENCOUNTER — Encounter (HOSPITAL_COMMUNITY): Payer: Self-pay

## 2024-11-22 DIAGNOSIS — M545 Low back pain, unspecified: Secondary | ICD-10-CM

## 2024-11-22 NOTE — ED Triage Notes (Signed)
 Patient states her PCP ordered a x-ray of her back and she needs to have this completed today. Patient states she fell on Thanksgiving. Patient just finished her a dose pack of prednisone .

## 2024-11-22 NOTE — ED Provider Notes (Signed)
 [facility]    CSN: [id number] Arrival date & time: 11/22/24  1217      History   Chief Complaint No chief complaint on file.   HPI Elaine Johns is a 48 y.o. female.   Patient presents to clinic over concern of right-sided lower back pain/hip pain after a fall sustained on Thanksgiving, 9 days prior.  Was seen shortly after the fall and given steroids, ibuprofen  and muscle relaxer.  Has since finished the steroids.  Has not any numbness, does not feel like her sciatica has been flared up.  Has been taking ibuprofen  with minimal relief.  Muscle relaxer makes her drowsy.  Followed up with her primary care provider who wanted imaging of her pelvis to rule out a fracture.  Has not had any leg numbness or incontinence.  Bending and twisting exacerbate the pain.  Pain not worsened with walking, sitting or standing. The area is tender to palpation.  The history is provided by the patient and medical records.    Past Medical History:  Diagnosis Date   Abnormal Pap smear and cervical HPV (human papillomavirus)    Anemia    Phreesia 12/29/2020   Anxiety    Phreesia 12/29/2020   History of miscarriage    Hyperlipidemia    Hypertension    Panic disorder     Patient Active Problem List   Diagnosis Date Noted   Smoker 07/06/2020   Hypertensive disorder 07/06/2020   Hypercholesterolemia 07/06/2020   Anxiety 07/06/2020   Vitamin D  deficiency 09/24/2013   Panic disorder     Past Surgical History:  Procedure Laterality Date   FRACTURE SURGERY N/A    [employer]reesia 12/29/2020   TUBAL LIGATION      OB History   No obstetric history on file.      Home Medications    Prior to Admission medications   Medication Sig Start Date End Date Taking? Authorizing Provider  Aspirin-Acetaminophen -Caffeine (GOODYS EXTRA STRENGTH) 500-325-65 MG PACK Take 1 packet by mouth 2 (two) times daily as needed (Pain).   Yes [provider]  Cholecalciferol (VITAMIN D ) 2000  UNITS CAPS Take 1 capsule by mouth daily.   Yes [provider]  clonazePAM  (KLONOPIN ) 0.5 MG tablet TAKE 1 TABLET (0.5 MG TOTAL) BY MOUTH 3 (THREE) TIMES DAILY AS NEEDED FOR ANXIETY. 11/07/24  Yes Duanne Butler DASEN, MD  HYDROcodone -acetaminophen  (NORCO/VICODIN) 5-325 MG tablet Take 1 tablet by mouth every 6 (six) hours as needed. 11/21/24  Yes Duanne Butler DASEN, MD  ibuprofen  (ADVIL ) 600 MG tablet Take 1 tablet (600 mg total) by mouth every 6 (six) hours as needed. 11/15/24  Yes Leatrice Vernell HERO, NP  levocetirizine (XYZAL ) 5 MG tablet TAKE 1 TABLET BY MOUTH EVERY DAY IN THE EVENING 09/29/24  Yes Duanne Butler DASEN, MD  losartan  (COZAAR ) 50 MG tablet TAKE 1 TABLET BY MOUTH EVERY DAY 10/03/24  Yes Duanne Butler DASEN, MD  methocarbamol  (ROBAXIN ) 750 MG tablet Take 1 tablet (750 mg total) by mouth 4 (four) times daily. 11/15/24  Yes Leatrice Vernell HERO, NP  methylPREDNISolone  (MEDROL  DOSEPAK) 4 MG TBPK tablet Take 6 pills on day 1, 2 pills on day 2, decreasing by one pill daily until complete 11/15/24  Yes Leatrice Vernell HERO, NP  venlafaxine  XR (EFFEXOR  XR) 75 MG 24 hr capsule Take 3 capsules (225 mg total) by mouth daily with breakfast. 08/21/24  Yes Duanne Butler DASEN, MD    Family History History reviewed. No pertinent family history.  Social  History Social History   Tobacco Use   Smoking status: Former    Current packs/day: 1.00    Types: Cigarettes   Smokeless tobacco: Never  Vaping Use   Vaping status: Every Day   Substances: Nicotine, Flavoring  Substance Use Topics   Alcohol use: Yes   Drug use: No     Allergies   Antihistamines, chlorpheniramine-type   Review of Systems Review of Systems  Per HPI  Physical Exam Triage Vital Signs ED Triage Vitals [11/22/24 1235]  Encounter Vitals Group     BP (!) 141/101     Girls Systolic BP Percentile      Girls Diastolic BP Percentile      Boys Systolic BP Percentile      Boys Diastolic BP Percentile      Pulse Rate 98      Resp 18     Temp 98.7 F (37.1 C)     Temp Source Oral     SpO2 98 %     Weight      Height      Head Circumference      Peak Flow      Pain Score      Pain Loc      Pain Education      Exclude from Growth Chart    No data found.  Updated Vital Signs BP (!) 141/101 (BP Location: Left Arm)   Pulse 98   Temp 98.7 F (37.1 C) (Oral)   Resp 18   LMP 11/17/2021   SpO2 98%   Visual Acuity Right Eye Distance:   Left Eye Distance:   Bilateral Distance:    Right Eye Near:   Left Eye Near:    Bilateral Near:     Physical Exam Vitals and nursing note reviewed.  Constitutional:      Appearance: Normal appearance.  HENT:     Head: Normocephalic and atraumatic.     Right Ear: External ear normal.     Left Ear: External ear normal.     Nose: Nose normal.     Mouth/Throat:     Mouth: Mucous membranes are moist.  Eyes:     Conjunctiva/sclera: Conjunctivae normal.  Cardiovascular:     Rate and Rhythm: Normal rate.  Pulmonary:     Effort: Pulmonary effort is normal. No respiratory distress.  Musculoskeletal:        General: Tenderness and signs of injury present. No swelling or deformity.       Legs:  Skin:    General: Skin is warm and dry.  Neurological:     General: No focal deficit present.     Mental Status: She is alert and oriented to person, place, and time.  Psychiatric:        Mood and Affect: Mood normal.        Behavior: Behavior normal.      UC Treatments / Results  Labs (all labs ordered are listed, but only abnormal results are displayed) Labs Reviewed - No data to display  EKG   Radiology DG Lumbar Spine Complete Result Date: 11/22/2024 EXAM: 4 OR MORE VIEW(S) XRAY OF THE LUMBAR SPINE 11/22/2024 01:23:04 PM COMPARISON: 06/26/2023 CLINICAL HISTORY: lower back pain FINDINGS: LUMBAR SPINE: BONES: Vertebral body heights are maintained. Alignment is normal. DISCS AND DEGENERATIVE CHANGES: No severe degenerative changes. SOFT TISSUES: 4 mm  angulated calcification within the left hemipelvis is indeterminate. This appears new when compared with the previous exam, correlate for any clinical signs  or symptoms of distal ureteral calculi. IMPRESSION: 1. No acute findings. 2. 4 mm angulated calcification within the left hemipelvis, indeterminate and new from prior. Correlate for any clinical signs or symptoms of distal ureteral calculi. . . Electronically signed by: Waddell Calk MD 11/22/2024 01:33 PM EST RP Workstation: [id number]   DG Pelvis 1-2 Views Result Date: 11/22/2024 EXAM: 1 or 2 view(s) Xray of the pelvis 11/22/2024 01:23:04 PM COMPARISON: None available. CLINICAL HISTORY: Lower back pain FINDINGS: BONES AND JOINTS: No acute fracture. No malalignment. SOFT TISSUES: The soft tissues are unremarkable. IMPRESSION: 1. No significant abnormality. Electronically signed by: Lynwood Seip MD 11/22/2024 01:32 PM EST RP Workstation: [id number]    Procedures Procedures (including critical care time)  Medications Ordered in UC Medications - No data to display  Initial Impression / Assessment and Plan / UC Course  I have reviewed the triage vital signs and the nursing notes.  Pertinent labs & imaging results that were available during my care of the patient were reviewed by me and considered in my medical decision making (see chart for details).  Vitals and triage reviewed, patient is hemodynamically stable.  Tender to palpation in the right sided soft tissue lumbar spine/right hip area.  Imaging by my interpretation does not show acute bony abnormality, radiology overread confirmed.  Encourage orthopedic follow-up if symptoms persist. Patient verbalized understanding, no questions at this time. Work note provided.     Final Clinical Impressions(s) / UC Diagnoses   Final diagnoses:  Acute right-sided low back pain without sciatica     Discharge Instructions      X-rays of your spine and pelvis did not show any fractures or bony  abnormalities  Continue to use the ibuprofen  as needed for pain  Heat or ice the area   Follow-up with Sports Medicine if needed      ED Prescriptions   None    I have reviewed the PDMP during this encounter.   Dreama, Jackquelyn Sundberg  N, FNP 11/22/24 1357

## 2024-11-22 NOTE — Discharge Instructions (Addendum)
 X-rays of your spine and pelvis did not show any fractures or bony abnormalities  Continue to use the ibuprofen  as needed for pain  Heat or ice the area   Follow-up with Sports Medicine if needed

## 2024-11-24 ENCOUNTER — Telehealth: Payer: Self-pay | Admitting: Pharmacy Technician

## 2024-11-24 ENCOUNTER — Other Ambulatory Visit (HOSPITAL_COMMUNITY): Payer: Self-pay

## 2024-11-24 ENCOUNTER — Ambulatory Visit: Payer: Self-pay | Admitting: Family Medicine

## 2024-11-24 NOTE — Telephone Encounter (Signed)
 PA request has been Started. New Encounter has been or will be created for follow up. For additional info see Pharmacy Prior Auth telephone encounter from 11/24/24.

## 2024-11-24 NOTE — Telephone Encounter (Signed)
 Pharmacy Patient Advocate Encounter   Received notification from Pt Calls Messages that prior authorization for HYDROcodone -Acetaminophen  5-325MG  tablets is required/requested.   Insurance verification completed.   The patient is insured through [employer].   Per test claim: PA required; PA started via CoverMyMeds. KEY [id number] . Waiting for clinical questions to populate.

## 2024-11-25 ENCOUNTER — Other Ambulatory Visit (HOSPITAL_COMMUNITY): Payer: Self-pay

## 2024-11-25 NOTE — Telephone Encounter (Signed)
 Pharmacy Patient Advocate Encounter  Received notification from [employer] that Prior Authorization for HYDROcodone -Acetaminophen  5-325MG  tablets has been APPROVED from 11/24/24 to 05/23/25. Unable to obtain price due to refill too soon rejection, last fill date 11/22/24 next available fill date 11/26/24   PA #/Case ID/Reference #: [id number]

## 2024-11-27 ENCOUNTER — Other Ambulatory Visit (HOSPITAL_COMMUNITY): Payer: Self-pay

## 2024-11-28 ENCOUNTER — Other Ambulatory Visit: Payer: Self-pay | Admitting: Family Medicine

## 2024-11-28 NOTE — Telephone Encounter (Signed)
 Copied from CRM #[id number]. Topic: Clinical - Medication Refill >> Nov 28, 2024  2:52 PM [name] wrote: Medication: HYDROcodone -acetaminophen  (NORCO/VICODIN) 5-325 MG tablet Me   Has the patient contacted their pharmacy? Yes (Agent: If no, request that the patient contact the pharmacy for the refill. If patient does not wish to contact the pharmacy document the reason why and proceed with request.) (Agent: If yes, when and what did the pharmacy advise?)  This is the patient's preferred pharmacy:  [facility] [address], [place], [place] [address] Phone: [phone]  Is this the correct pharmacy for this prescription? Yes If no, delete pharmacy and type the correct one.   Has the prescription been filled recently? Yes  Is the patient out of the medication? Yes  Has the patient been seen for an appointment in the last year OR does the patient have an upcoming appointment? Yes  Can we respond through MyChart? Yes  Agent: Please be advised that Rx refills may take up to 3 business days. We ask that you follow-up with your pharmacy.

## 2024-12-02 MED ORDER — HYDROCODONE-ACETAMINOPHEN 5-325 MG PO TABS: 1.0000 | ORAL_TABLET | Freq: Four times a day (QID) | ORAL | 0 refills | Status: AC | PRN

## 2024-12-05 ENCOUNTER — Encounter: Admitting: Family Medicine

## 2024-12-08 ENCOUNTER — Other Ambulatory Visit: Payer: Self-pay | Admitting: Family Medicine

## 2024-12-08 DIAGNOSIS — J40 Bronchitis, not specified as acute or chronic: Secondary | ICD-10-CM

## 2024-12-08 NOTE — Telephone Encounter (Signed)
 Copied from CRM #[id number]. Topic: Clinical - Medication Refill >> Dec 08, 2024 11:38 AM Zy'onna H wrote: Medication: clonazePAM  (KLONOPIN ) 0.5 MG tablet  Has the patient contacted their pharmacy? Yes (Agent: If no, request that the patient contact the pharmacy for the refill. If patient does not wish to contact the pharmacy document the reason why and proceed with request.) (Agent: If yes, when and what did the pharmacy advise?)  This is the patient's preferred pharmacy:  [id number] [place], [place] - [address] AT [facility] [address] [place] [address] Phone: [phone] Fax: [phone]  Is this the correct pharmacy for this prescription? Yes If no, delete pharmacy and type the correct one.   Has the prescription been filled recently? Yes  Is the patient out of the medication? Yes  Has the patient been seen for an appointment in the last year OR does the patient have an upcoming appointment? Yes  Can we respond through MyChart? Yes  Agent: Please be advised that Rx refills may take up to 3 business days. We ask that you follow-up with your pharmacy.

## 2024-12-10 NOTE — Telephone Encounter (Signed)
 Requested medication (s) are due for refill today: yes  Requested medication (s) are on the active medication list: Yes  Last refill:  11/07/24 #90  Future visit scheduled: no  Notes to clinic:  Please review for refill. Refill not delegated per protocol.    Requested Prescriptions  Pending Prescriptions Disp Refills   clonazePAM  (KLONOPIN ) 0.5 MG tablet 90 tablet 0    Sig: Take 1 tablet (0.5 mg total) by mouth 3 (three) times daily as needed for anxiety.     Not Delegated - Psychiatry: Anxiolytics/Hypnotics 2 Failed - 12/10/2024 11:55 AM      Failed - This refill cannot be delegated      Failed - Urine Drug Screen completed in last 360 days      Passed - Patient is not pregnant      Passed - Valid encounter within last 6 months    Recent Outpatient Visits           2 weeks ago Right hip pain   [facility] Pickard, Butler DASEN, MD   9 months ago GAD (generalized anxiety disorder)   [facility] Duanne Butler DASEN, MD   1 year ago Right sided sciatica   [facility] Duanne Butler DASEN, MD   1 year ago Pure hypercholesterolemia   [facility] Duanne Butler DASEN, MD   2 years ago Traumatic hematoma of left forearm, subsequent encounter    [facility] Pickard, Butler DASEN, MD

## 2024-12-12 MED ORDER — CLONAZEPAM 0.5 MG PO TABS: 0.5000 mg | ORAL_TABLET | Freq: Three times a day (TID) | ORAL | 0 refills | Status: AC | PRN

## 2025-01-13 ENCOUNTER — Telehealth
